# Patient Record
Sex: Female | Born: 1985
Health system: Southern US, Community
[De-identification: ages and names within clinical notes are randomized; demographics above are authoritative.]

## PROBLEM LIST (undated history)

## (undated) DIAGNOSIS — K219 Gastro-esophageal reflux disease without esophagitis: Secondary | ICD-10-CM

## (undated) DIAGNOSIS — J309 Allergic rhinitis, unspecified: Secondary | ICD-10-CM

## (undated) DIAGNOSIS — Z87440 Personal history of urinary (tract) infections: Secondary | ICD-10-CM

## (undated) HISTORY — DX: Personal history of urinary (tract) infections: Z87.440

## (undated) HISTORY — DX: Gastro-esophageal reflux disease without esophagitis: K21.9

## (undated) HISTORY — DX: Allergic rhinitis, unspecified: J30.9

---
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
## 2009-10-30 ENCOUNTER — Emergency Department (HOSPITAL_COMMUNITY): Admission: EM | Admit: 2009-10-30 | Discharge: 2009-10-30 | Payer: Self-pay | Admitting: Family Medicine

## 2011-03-24 LAB — POCT URINALYSIS DIP (DEVICE)
Ketones, ur: 15 mg/dL — AB
Protein, ur: NEGATIVE mg/dL
Specific Gravity, Urine: <=1.005 (ref 1.005–1.030)
Urobilinogen, UA: 0.2 mg/dL (ref 0.0–1.0)
pH: 6 (ref 5.0–8.0)

## 2011-03-24 LAB — URINE CULTURE

## 2011-07-01 ENCOUNTER — Inpatient Hospital Stay (INDEPENDENT_AMBULATORY_CARE_PROVIDER_SITE_OTHER)
Admission: RE | Admit: 2011-07-01 | Discharge: 2011-07-01 | Disposition: A | Discharge status: Home / Self Care | Payer: 59 | Source: Ambulatory Visit | Attending: Family Medicine | Admitting: Family Medicine

## 2011-07-01 DIAGNOSIS — L0231 Cutaneous abscess of buttock: Secondary | ICD-10-CM

## 2016-02-09 MED FILL — ESOMEPRAZOLE MAG DR 40 MG C: 40 | 90 days supply | Qty: 90 | Fill #5

## 2016-05-12 MED FILL — ESOMEPRAZOLE MAG DR 40 MG C: 40 | 90 days supply | Qty: 90 | Fill #6

## 2016-07-21 DIAGNOSIS — R51 Headache: Secondary | ICD-10-CM | POA: Diagnosis not present

## 2016-08-10 DIAGNOSIS — Z3169 Encounter for other general counseling and advice on procreation: Secondary | ICD-10-CM | POA: Diagnosis not present

## 2016-08-10 DIAGNOSIS — K219 Gastro-esophageal reflux disease without esophagitis: Secondary | ICD-10-CM | POA: Diagnosis not present

## 2016-08-10 DIAGNOSIS — Z01419 Encounter for gynecological examination (general) (routine) without abnormal findings: Secondary | ICD-10-CM | POA: Diagnosis not present

## 2016-08-12 MED FILL — ESOMEPRAZOLE MAG DR 40 MG C: 40 | 30 days supply | Qty: 30 | Fill #0

## 2016-09-16 MED FILL — ESOMEPRAZOLE MAG DR 40 MG C: 40 | 90 days supply | Qty: 90 | Fill #0

## 2016-12-14 MED FILL — ESOMEPRAZOLE MAG DR 40 MG C: 40 | 90 days supply | Qty: 90 | Fill #1

## 2017-03-15 MED FILL — ESOMEPRAZOLE MAG DR 40 MG C: 40 | 90 days supply | Qty: 90 | Fill #2

## 2017-06-14 MED FILL — ESOMEPRAZOLE MAG DR 40 MG C: 40 | 90 days supply | Qty: 90 | Fill #3

## 2017-09-19 ENCOUNTER — Ambulatory Visit (INDEPENDENT_AMBULATORY_CARE_PROVIDER_SITE_OTHER): Payer: 59 | Admitting: Obstetrics and Gynecology

## 2017-09-19 ENCOUNTER — Encounter: Payer: Self-pay | Admitting: Obstetrics and Gynecology

## 2017-09-19 VITALS — BP 120/76 | HR 75 | Ht 60.75 in | Wt 196.0 lb

## 2017-09-19 DIAGNOSIS — Z3009 Encounter for other general counseling and advice on contraception: Secondary | ICD-10-CM

## 2017-09-19 DIAGNOSIS — Z01419 Encounter for gynecological examination (general) (routine) without abnormal findings: Secondary | ICD-10-CM

## 2017-09-19 DIAGNOSIS — K219 Gastro-esophageal reflux disease without esophagitis: Secondary | ICD-10-CM | POA: Diagnosis not present

## 2017-09-19 MED ORDER — ESOMEPRAZOLE MAGNESIUM 40 MG PO CPDR: 40.0000 mg | DELAYED_RELEASE_CAPSULE | Freq: Every day | ORAL | 11 refills | Status: DC

## 2017-09-19 MED FILL — ESOMEPRAZOLE MAG DR 40 MG C: 40 | 30 days supply | Qty: 30 | Fill #0

## 2017-09-19 NOTE — Progress Notes (Signed)
PCP:  Patient, No Pcp Per   Chief Complaint  Patient presents with  . Gynecologic Exam     HPI:      [name] is a 31 y.o. G0P0000 who LMP was Patient's last menstrual period was 09/10/2017 (exact date)., presents today for her annual examination.  Her menses are regular every 28-30 days, lasting 7 days.  Dysmenorrhea mild, occurring first 1-2 days of flow. She does not have intermenstrual bleeding.  Sex activity: single partner, contraception - none. Pt and husband are separated but still sex active. Were trying to conceive since 2015 but not now. Pt had ovulation with serum progesterone.   Last Pap: 07/14/15  Results were: no abnormalities /neg HPV DNA  Hx of STDs: none  There is a FH of breast cancer in her MGM, genetic testing not indicated. There is no FH of ovarian cancer. The patient does not do self-breast exams.  Tobacco use: The patient currently smokes 1/2  packs of cigarettes per day for the past many years. Alcohol use: none No drug use.  Exercise: not active  She does not get adequate calcium and Vitamin D in her diet.  She has a hx of GERD and takes nexium daily. She needs Rx RF.   Past Medical History:  Diagnosis Date  . Allergic rhinitis   . GERD (gastroesophageal reflux disease)   . History of urinary tract infection     Past Surgical History:  Procedure Laterality Date  . APPENDECTOMY  2003    Family History  Problem Relation Age of Onset  . Endometriosis Mother   . Fibromyalgia Mother   . Irritable bowel syndrome Mother   . Melanoma Mother   . Hyperlipidemia Father   . Hypertension Father   . Crohn's disease Brother   . Bladder Cancer Maternal Grandmother 6  . Breast cancer Maternal Grandmother 62  . Hypertension Maternal Grandfather   . Lung cancer Maternal Grandfather 6  . Heart disease Paternal Grandfather   . Hypertension Paternal Grandfather     Social History   Social History  . Marital status: Legally Separated      Spouse name: N/A  . Number of children: N/A  . Years of education: N/A   Occupational History  . Not on file.   Social History Main Topics  . Smoking status: Current Every Day Smoker    Packs/day: 0.50    Types: Cigarettes  . Smokeless tobacco: Never Used  . Alcohol use Yes  . Drug use: No  . Sexual activity: Yes    Birth control/ protection: None   Other Topics Concern  . Not on file   Social History Narrative  . No narrative on file    Current Meds  Medication Sig  . esomeprazole (NEXIUM) 40 MG capsule Take 1 capsule (40 mg total) by mouth daily.  . fexofenadine (ALLEGRA) 180 MG tablet Take by mouth.  . [DISCONTINUED] esomeprazole (NEXIUM) 40 MG capsule      ROS:  Review of Systems  Constitutional: Negative for fatigue, fever and unexpected weight change.  Respiratory: Negative for cough, shortness of breath and wheezing.   Cardiovascular: Negative for chest pain, palpitations and leg swelling.  Gastrointestinal: Negative for blood in stool, constipation, diarrhea, nausea and vomiting.  Endocrine: Negative for cold intolerance, heat intolerance and polyuria.  Genitourinary: Negative for dyspareunia, dysuria, flank pain, frequency, genital sores, hematuria, menstrual problem, pelvic pain, urgency, vaginal bleeding, vaginal discharge and vaginal pain.  Musculoskeletal: Negative for back  pain, joint swelling and myalgias.  Skin: Negative for rash.  Neurological: Negative for dizziness, syncope, light-headedness, numbness and headaches.  Hematological: Negative for adenopathy.  Psychiatric/Behavioral: Negative for agitation, confusion, sleep disturbance and suicidal ideas. The patient is not nervous/anxious.      Objective: BP 120/76   Pulse 75   Ht 5' 0.75" (1.543 m)   Wt 196 lb (88.9 kg)   LMP 09/10/2017 (Exact Date)   BMI 37.34 kg/m    Physical Exam  Constitutional: She is oriented to person, place, and time. She appears well-developed and  well-nourished.  Genitourinary: Vagina normal and uterus normal. There is no rash or tenderness on the right labia. There is no rash or tenderness on the left labia. No erythema or tenderness in the vagina. No vaginal discharge found. Right adnexum does not display mass and does not display tenderness. Left adnexum does not display mass and does not display tenderness. Cervix does not exhibit motion tenderness or polyp. Uterus is not enlarged or tender.  Neck: Normal range of motion. No thyromegaly present.  Cardiovascular: Normal rate, regular rhythm and normal heart sounds.   No murmur heard. Pulmonary/Chest: Effort normal and breath sounds normal. Right breast exhibits no mass, no nipple discharge, no skin change and no tenderness. Left breast exhibits no mass, no nipple discharge, no skin change and no tenderness.  Abdominal: Soft. There is no tenderness. There is no guarding.  Musculoskeletal: Normal range of motion.  Neurological: She is alert and oriented to person, place, and time. No cranial nerve deficit.  Psychiatric: She has a normal mood and affect. Her behavior is normal.  Vitals reviewed.   Assessment/Plan: Encounter for annual routine gynecological examination  Encounter for other general counseling or advice on contraception - Pt declines BC for now. Will f/u prn.  Gastroesophageal reflux disease without esophagitis - Rx RF nexium.  - Plan: esomeprazole (NEXIUM) 40 MG capsule            GYN counsel family planning choices, adequate intake of calcium and vitamin D, diet and exercise     F/U  Return in about 1 year (around 09/19/2018).  Elizabeth Day B. Kahlan Engebretson, PA-C 09/19/2017 11:25 AM

## 2017-10-17 MED FILL — ESOMEPRAZOLE MAG DR 40 MG C: 40 | 90 days supply | Qty: 90 | Fill #1

## 2018-01-17 MED FILL — ESOMEPRAZOLE MAG DR 40 MG C: 40 | 90 days supply | Qty: 90 | Fill #2

## 2018-04-18 MED FILL — ESOMEPRAZOLE MAG DR 40 MG C: 40 | 90 days supply | Qty: 90 | Fill #3

## 2018-07-03 ENCOUNTER — Other Ambulatory Visit: Payer: Self-pay | Admitting: Certified Nurse Midwife

## 2018-07-18 ENCOUNTER — Other Ambulatory Visit: Payer: Self-pay | Admitting: Obstetrics and Gynecology

## 2018-07-18 DIAGNOSIS — K219 Gastro-esophageal reflux disease without esophagitis: Secondary | ICD-10-CM

## 2018-07-18 MED FILL — ESOMEPRAZOLE MAG DR 40 MG C: 40 | 90 days supply | Qty: 90 | Fill #0

## 2018-07-18 NOTE — Telephone Encounter (Signed)
Please advise. Thank you

## 2018-09-20 ENCOUNTER — Ambulatory Visit (INDEPENDENT_AMBULATORY_CARE_PROVIDER_SITE_OTHER): Payer: 59 | Admitting: Obstetrics and Gynecology

## 2018-09-20 ENCOUNTER — Other Ambulatory Visit (HOSPITAL_COMMUNITY)
Admission: RE | Admit: 2018-09-20 | Discharge: 2018-09-20 | Disposition: A | Discharge status: Home / Self Care | Payer: 59 | Source: Ambulatory Visit | Attending: Obstetrics and Gynecology | Admitting: Obstetrics and Gynecology

## 2018-09-20 ENCOUNTER — Encounter: Payer: Self-pay | Admitting: Obstetrics and Gynecology

## 2018-09-20 VITALS — BP 130/64 | HR 71 | Ht 61.0 in | Wt 172.0 lb

## 2018-09-20 DIAGNOSIS — Z124 Encounter for screening for malignant neoplasm of cervix: Secondary | ICD-10-CM

## 2018-09-20 DIAGNOSIS — Z3009 Encounter for other general counseling and advice on contraception: Secondary | ICD-10-CM

## 2018-09-20 DIAGNOSIS — Z720 Tobacco use: Secondary | ICD-10-CM | POA: Diagnosis not present

## 2018-09-20 DIAGNOSIS — Z01411 Encounter for gynecological examination (general) (routine) with abnormal findings: Secondary | ICD-10-CM | POA: Diagnosis not present

## 2018-09-20 DIAGNOSIS — K219 Gastro-esophageal reflux disease without esophagitis: Secondary | ICD-10-CM | POA: Diagnosis not present

## 2018-09-20 DIAGNOSIS — Z1151 Encounter for screening for human papillomavirus (HPV): Secondary | ICD-10-CM | POA: Insufficient documentation

## 2018-09-20 DIAGNOSIS — Z113 Encounter for screening for infections with a predominantly sexual mode of transmission: Secondary | ICD-10-CM | POA: Diagnosis not present

## 2018-09-20 DIAGNOSIS — Z01419 Encounter for gynecological examination (general) (routine) without abnormal findings: Secondary | ICD-10-CM

## 2018-09-20 MED ORDER — ESOMEPRAZOLE MAGNESIUM 40 MG PO CPDR: 40.0000 mg | DELAYED_RELEASE_CAPSULE | Freq: Every day | ORAL | 3 refills | Status: DC

## 2018-09-20 NOTE — Patient Instructions (Signed)
I value your feedback and entrusting us with your care. If you get a [facility] patient survey, I would appreciate you taking the time to let us know about your experience today. Thank you! 

## 2018-09-20 NOTE — Progress Notes (Signed)
PCP:  Patient, No Pcp Per   Chief Complaint  Patient presents with  . Gynecologic Exam    pt would like std testing, interested in [facility]     HPI:      [name] is a 32 y.o. G0P0000 who LMP was Patient's last menstrual period was 09/18/2018 (exact date)., presents today for her annual examination.  Her menses are regular every 28-30 days, lasting 7 days.  Dysmenorrhea mild, occurring first 1-2 days of flow. She does not have intermenstrual bleeding.  Sex activity: single partner, contraception - condoms sometimes. Considering BC again but feels better off OCPs.  Hx of infertility in past with ex-husband. Pt had ovulation with serum progesterone at that time.  Last Pap: 07/14/15  Results were: no abnormalities /neg HPV DNA  Hx of STDs: none  There is a FH of breast cancer in her MGM, genetic testing not indicated. There is no FH of ovarian cancer. The patient does not do self-breast exams.  Tobacco use: 1 ppd, up from 1/2 ppd last yr.  Alcohol use: daily No drug use.  Exercise: not active  She does not get adequate calcium and Vitamin D in her diet.  She has a hx of GERD and takes nexium daily. She needs Rx RF.   Past Medical History:  Diagnosis Date  . Allergic rhinitis   . GERD (gastroesophageal reflux disease)   . History of urinary tract infection     Past Surgical History:  Procedure Laterality Date  . APPENDECTOMY  2003    Family History  Problem Relation Age of Onset  . Endometriosis Mother   . Fibromyalgia Mother   . Irritable bowel syndrome Mother   . Melanoma Mother   . Hyperlipidemia Father   . Hypertension Father   . Crohn's disease Brother   . Bladder Cancer Maternal Grandmother 39  . Breast cancer Maternal Grandmother 42  . Hypertension Maternal Grandfather   . Lung cancer Maternal Grandfather 54  . Heart disease Paternal Grandfather   . Hypertension Paternal Grandfather     Social History   Socioeconomic History  . Marital  status: Legally Separated    Spouse name: Not on file  . Number of children: Not on file  . Years of education: Not on file  . Highest education level: Not on file  Occupational History  . Not on file  Social Needs  . Financial resource strain: Not on file  . Food insecurity:    Worry: Not on file    Inability: Not on file  . Transportation needs:    Medical: Not on file    Non-medical: Not on file  Tobacco Use  . Smoking status: Current Every Day Smoker    Packs/day: 0.50    Types: Cigarettes  . Smokeless tobacco: Never Used  Substance and Sexual Activity  . Alcohol use: Yes  . Drug use: No  . Sexual activity: Yes    Birth control/protection: None  Lifestyle  . Physical activity:    Days per week: Not on file    Minutes per session: Not on file  . Stress: Not on file  Relationships  . Social connections:    Talks on phone: Not on file    Gets together: Not on file    Attends religious service: Not on file    Active member of club or organization: Not on file    Attends meetings of clubs or organizations: Not on file    Relationship  status: Not on file  . Intimate partner violence:    Fear of current or ex partner: Not on file    Emotionally abused: Not on file    Physically abused: Not on file    Forced sexual activity: Not on file  Other Topics Concern  . Not on file  Social History Narrative  . Not on file    Current Meds  Medication Sig  . esomeprazole (NEXIUM) 40 MG capsule Take 1 capsule (40 mg total) by mouth daily.  . fexofenadine (ALLEGRA) 180 MG tablet Take by mouth.  . [DISCONTINUED] esomeprazole (NEXIUM) 40 MG capsule TAKE 1 CAPSULE BY MOUTH ONCE DAILY     ROS:  Review of Systems  Constitutional: Negative for fatigue, fever and unexpected weight change.  Respiratory: Negative for cough, shortness of breath and wheezing.   Cardiovascular: Negative for chest pain, palpitations and leg swelling.  Gastrointestinal: Negative for blood in stool,  constipation, diarrhea, nausea and vomiting.  Endocrine: Negative for cold intolerance, heat intolerance and polyuria.  Genitourinary: Negative for dyspareunia, dysuria, flank pain, frequency, genital sores, hematuria, menstrual problem, pelvic pain, urgency, vaginal bleeding, vaginal discharge and vaginal pain.  Musculoskeletal: Negative for back pain, joint swelling and myalgias.  Skin: Negative for rash.  Neurological: Negative for dizziness, syncope, light-headedness, numbness and headaches.  Hematological: Negative for adenopathy.  Psychiatric/Behavioral: Negative for agitation, confusion, sleep disturbance and suicidal ideas. The patient is not nervous/anxious.      Objective: BP 130/64   Pulse 71   Ht 5\' 1"  (1.549 m)   Wt 172 lb (78 kg)   LMP 09/18/2018 (Exact Date)   BMI 32.50 kg/m    Physical Exam  Constitutional: She is oriented to person, place, and time. She appears well-developed and well-nourished.  Genitourinary: Vagina normal and uterus normal. There is no rash or tenderness on the right labia. There is no rash or tenderness on the left labia. No erythema or tenderness in the vagina. No vaginal discharge found. Right adnexum does not display mass and does not display tenderness. Left adnexum does not display mass and does not display tenderness. Cervix does not exhibit motion tenderness or polyp. Uterus is not enlarged or tender.  Neck: Normal range of motion. No thyromegaly present.  Cardiovascular: Normal rate, regular rhythm and normal heart sounds.  No murmur heard. Pulmonary/Chest: Effort normal and breath sounds normal. Right breast exhibits no mass, no nipple discharge, no skin change and no tenderness. Left breast exhibits no mass, no nipple discharge, no skin change and no tenderness.  Abdominal: Soft. There is no tenderness. There is no guarding.  Musculoskeletal: Normal range of motion.  Neurological: She is alert and oriented to person, place, and time. No  cranial nerve deficit.  Psychiatric: She has a normal mood and affect. Her behavior is normal.  Vitals reviewed.   Assessment/Plan: Encounter for annual routine gynecological examination  Cervical cancer screening - Plan: Cytology - PAP, CANCELED: Cytology - PAP  Screening for STD (sexually transmitted disease) - Plan: Cytology - PAP  Screening for HPV (human papillomavirus) - Plan: Cytology - PAP, CANCELED: Cytology - PAP  Encounter for other general counseling or advice on contraception - BC options discussed. Recommended kyleena. Handout given. RTO with menses if desires. Will need cytotec Rx. F/u prn.   Gastroesophageal reflux disease without esophagitis - Rx RF nexium. D/C tobacco to help wiht sx.  - Plan: esomeprazole (NEXIUM) 40 MG capsule  Tobacco use - Recommended cessation    Meds ordered this  encounter  Medications  . esomeprazole (NEXIUM) 40 MG capsule    Sig: Take 1 capsule (40 mg total) by mouth daily.    Dispense:  90 capsule    Refill:  3    Order Specific Question:   Supervising Provider    Answer:   Gae Dry [[id number]]            GYN counsel family planning choices, adequate intake of calcium and vitamin D, diet and exercise     F/U  Return in about 1 year (around 09/21/2019).  Alicia B. Copland, PA-C 09/20/2018 10:28 AM

## 2018-09-21 LAB — CYTOLOGY - PAP
Adequacy: ABSENT
Chlamydia: NEGATIVE
DIAGNOSIS: NEGATIVE
HPV (WINDOPATH): NOT DETECTED
Neisseria Gonorrhea: NEGATIVE
TRICH (WINDOWPATH): NEGATIVE

## 2018-09-22 ENCOUNTER — Telehealth: Payer: 59 | Admitting: Family

## 2018-09-22 DIAGNOSIS — N390 Urinary tract infection, site not specified: Secondary | ICD-10-CM | POA: Diagnosis not present

## 2018-09-22 MED ORDER — NITROFURANTOIN MONOHYD MACRO 100 MG PO CAPS: 100.0000 mg | ORAL_CAPSULE | Freq: Two times a day (BID) | ORAL | 0 refills | Status: DC

## 2018-09-22 NOTE — Progress Notes (Signed)

## 2018-10-17 MED FILL — ESOMEPRAZOLE MAG DR 40 MG C: 40 | 90 days supply | Qty: 90 | Fill #0

## 2019-01-16 MED FILL — ESOMEPRAZOLE MAG DR 40 MG C: 40 | 90 days supply | Qty: 90 | Fill #1

## 2019-04-10 MED FILL — ESOMEPRAZOLE MAG DR 40 MG C: 40 | 90 days supply | Qty: 90 | Fill #2

## 2019-07-16 MED FILL — ESOMEPRAZOLE MAG DR 40 MG C: 40 | 90 days supply | Qty: 90 | Fill #3

## 2019-10-03 ENCOUNTER — Other Ambulatory Visit: Payer: Self-pay | Admitting: Obstetrics and Gynecology

## 2019-10-03 DIAGNOSIS — K219 Gastro-esophageal reflux disease without esophagitis: Secondary | ICD-10-CM

## 2019-10-08 MED FILL — ESOMEPRAZOLE MAG DR 40 MG C: 40 | 90 days supply | Qty: 90 | Fill #0

## 2019-10-16 NOTE — Progress Notes (Signed)
PCP:  Patient, No Pcp Per   Chief Complaint  Patient presents with  . Gynecologic Exam     HPI:      [name] is a 33 y.o. G0P0000 who LMP was Patient's last menstrual period was 09/23/2019 (exact date)., presents today for her annual examination.  Her menses are regular every 28-30 days, lasting 7 days.  Dysmenorrhea mild, occurring first 1-2 days of flow. She does not have intermenstrual bleeding.  Sex activity: single partner, contraception - none. Declines BC. Not taking PNVs. Was considering BC again last yr but feels better off OCPs.  Hx of infertility in past with ex-husband. Pt had ovulation with serum progesterone at that time.  Last Pap: 09/20/18  Results were: no abnormalities /neg HPV DNA Hx of STDs: none; would like STD testing today.  There is a FH of breast cancer in her MGM, genetic testing not indicated. There is no FH of ovarian cancer. The patient does not do self-breast exams.  Tobacco use: 1 ppd, used to be 1/2 ppd Alcohol use: none No drug use.  Exercise: not active  She does not get adequate calcium and Vitamin D in her diet.  She has a hx of GERD and takes nexium daily with sx relief.  She needs Rx RF.   Past Medical History:  Diagnosis Date  . Allergic rhinitis   . GERD (gastroesophageal reflux disease)   . History of urinary tract infection     Past Surgical History:  Procedure Laterality Date  . APPENDECTOMY  2003    Family History  Problem Relation Age of Onset  . Endometriosis Mother   . Fibromyalgia Mother   . Irritable bowel syndrome Mother   . Melanoma Mother   . Hyperlipidemia Father   . Hypertension Father   . Crohn's disease Brother   . Bladder Cancer Maternal Grandmother 63  . Breast cancer Maternal Grandmother 70       has contact  . Hypertension Maternal Grandfather   . Lung cancer Maternal Grandfather 84  . Heart disease Paternal Grandfather   . Hypertension Paternal Grandfather     Social History    Socioeconomic History  . Marital status: Legally Separated    Spouse name: Not on file  . Number of children: Not on file  . Years of education: Not on file  . Highest education level: Not on file  Occupational History  . Not on file  Social Needs  . Financial resource strain: Not on file  . Food insecurity    Worry: Not on file    Inability: Not on file  . Transportation needs    Medical: Not on file    Non-medical: Not on file  Tobacco Use  . Smoking status: Current Every Day Smoker    Packs/day: 0.50    Types: Cigarettes  . Smokeless tobacco: Never Used  Substance and Sexual Activity  . Alcohol use: Yes  . Drug use: No  . Sexual activity: Yes    Birth control/protection: None  Lifestyle  . Physical activity    Days per week: Not on file    Minutes per session: Not on file  . Stress: Not on file  Relationships  . Social Herbalist on phone: Not on file    Gets together: Not on file    Attends religious service: Not on file    Active member of club or organization: Not on file    Attends meetings of  clubs or organizations: Not on file    Relationship status: Not on file  . Intimate partner violence    Fear of current or ex partner: Not on file    Emotionally abused: Not on file    Physically abused: Not on file    Forced sexual activity: Not on file  Other Topics Concern  . Not on file  Social History Narrative  . Not on file    Current Meds  Medication Sig  . esomeprazole (NEXIUM) 40 MG capsule Take 1 capsule (40 mg total) by mouth daily.  . fexofenadine (ALLEGRA) 180 MG tablet Take by mouth.  . [DISCONTINUED] esomeprazole (NEXIUM) 40 MG capsule TAKE 1 CAPSULE BY MOUTH DAILY.     ROS:  Review of Systems  Constitutional: Negative for fatigue, fever and unexpected weight change.  Respiratory: Negative for cough, shortness of breath and wheezing.   Cardiovascular: Negative for chest pain, palpitations and leg swelling.  Gastrointestinal:  Negative for blood in stool, constipation, diarrhea, nausea and vomiting.  Endocrine: Negative for cold intolerance, heat intolerance and polyuria.  Genitourinary: Negative for dyspareunia, dysuria, flank pain, frequency, genital sores, hematuria, menstrual problem, pelvic pain, urgency, vaginal bleeding, vaginal discharge and vaginal pain.  Musculoskeletal: Negative for back pain, joint swelling and myalgias.  Skin: Negative for rash.  Neurological: Negative for dizziness, syncope, light-headedness, numbness and headaches.  Hematological: Negative for adenopathy.  Psychiatric/Behavioral: Negative for agitation, confusion, sleep disturbance and suicidal ideas. The patient is not nervous/anxious.      Objective: BP 100/80   Ht 5' 3.75" (1.619 m)   Wt 179 lb (81.2 kg)   LMP 09/23/2019 (Exact Date)   BMI 30.97 kg/m    Physical Exam Constitutional:      Appearance: She is well-developed.  Genitourinary:     Vulva, vagina, uterus, right adnexa and left adnexa normal.     No vulval lesion or tenderness noted.     No vaginal discharge, erythema or tenderness.     No cervical motion tenderness or polyp.     Uterus is not enlarged or tender.     No right or left adnexal mass present.     Right adnexa not tender.     Left adnexa not tender.  Neck:     Musculoskeletal: Normal range of motion.     Thyroid: No thyromegaly.  Cardiovascular:     Rate and Rhythm: Normal rate and regular rhythm.     Heart sounds: Normal heart sounds. No murmur.  Pulmonary:     Effort: Pulmonary effort is normal.     Breath sounds: Normal breath sounds.  Chest:     Breasts:        Right: No mass, nipple discharge, skin change or tenderness.        Left: No mass, nipple discharge, skin change or tenderness.  Abdominal:     Palpations: Abdomen is soft.     Tenderness: There is no abdominal tenderness. There is no guarding.  Musculoskeletal: Normal range of motion.  Neurological:     General: No focal  deficit present.     Mental Status: She is alert and oriented to person, place, and time.     Cranial Nerves: No cranial nerve deficit.  Skin:    General: Skin is warm and dry.  Psychiatric:        Mood and Affect: Mood normal.        Behavior: Behavior normal.        Thought Content: Thought  content normal.        Judgment: Judgment normal.  Vitals signs reviewed.     Assessment/Plan: Encounter for annual routine gynecological examination  Screening for STD (sexually transmitted disease) - Plan: Cervicovaginal ancillary only  Encounter for other general counseling or advice on contraception; Pt to f/u if desires BC. Start PNVs in meantime.  Gastroesophageal reflux disease without esophagitis - Rx RF nexium. D/C tobacco to help wiht sx.  - Plan: esomeprazole (NEXIUM) 40 MG capsule    Meds ordered this encounter  Medications  . esomeprazole (NEXIUM) 40 MG capsule    Sig: Take 1 capsule (40 mg total) by mouth daily.    Dispense:  90 capsule    Refill:  3    Order Specific Question:   Supervising Provider    Answer:   Gae Dry [id number]            GYN counsel family planning choices, adequate intake of calcium and vitamin D, diet and exercise     F/U  Return in about 1 year (around 10/16/2020).   B. , PA-C 10/17/2019 11:32 AM

## 2019-10-17 ENCOUNTER — Ambulatory Visit (INDEPENDENT_AMBULATORY_CARE_PROVIDER_SITE_OTHER): Payer: 59 | Admitting: Obstetrics and Gynecology

## 2019-10-17 ENCOUNTER — Other Ambulatory Visit: Payer: Self-pay

## 2019-10-17 ENCOUNTER — Other Ambulatory Visit (HOSPITAL_COMMUNITY)
Admission: RE | Admit: 2019-10-17 | Discharge: 2019-10-17 | Disposition: A | Discharge status: Home / Self Care | Payer: 59 | Source: Ambulatory Visit | Attending: Obstetrics and Gynecology | Admitting: Obstetrics and Gynecology

## 2019-10-17 ENCOUNTER — Encounter: Payer: Self-pay | Admitting: Obstetrics and Gynecology

## 2019-10-17 VITALS — BP 100/80 | Ht 63.75 in | Wt 179.0 lb

## 2019-10-17 DIAGNOSIS — Z113 Encounter for screening for infections with a predominantly sexual mode of transmission: Secondary | ICD-10-CM | POA: Diagnosis not present

## 2019-10-17 DIAGNOSIS — Z01419 Encounter for gynecological examination (general) (routine) without abnormal findings: Secondary | ICD-10-CM | POA: Diagnosis not present

## 2019-10-17 DIAGNOSIS — Z3009 Encounter for other general counseling and advice on contraception: Secondary | ICD-10-CM

## 2019-10-17 DIAGNOSIS — K219 Gastro-esophageal reflux disease without esophagitis: Secondary | ICD-10-CM

## 2019-10-17 MED ORDER — ESOMEPRAZOLE MAGNESIUM 40 MG PO CPDR: 40.0000 mg | DELAYED_RELEASE_CAPSULE | Freq: Every day | ORAL | 3 refills | Status: DC

## 2019-10-17 NOTE — Patient Instructions (Signed)
I value your feedback and entrusting us with your care. If you get a [facility] patient survey, I would appreciate you taking the time to let us know about your experience today. Thank you! 

## 2019-10-19 LAB — CERVICOVAGINAL ANCILLARY ONLY
Chlamydia: NEGATIVE
Comment: NEGATIVE
Comment: NORMAL
Neisseria Gonorrhea: NEGATIVE

## 2019-11-06 ENCOUNTER — Telehealth: Payer: 59 | Admitting: Emergency Medicine

## 2019-11-06 DIAGNOSIS — R300 Dysuria: Secondary | ICD-10-CM | POA: Diagnosis not present

## 2019-11-06 MED ORDER — CEPHALEXIN 500 MG PO CAPS: 500.0000 mg | ORAL_CAPSULE | Freq: Two times a day (BID) | ORAL | 0 refills | Status: DC

## 2019-11-06 MED FILL — CEPHALEXIN 500 MG CAPSULE: 500 | 7 days supply | Qty: 14 | Fill #0

## 2019-11-06 NOTE — Progress Notes (Signed)
We are sorry that you are not feeling well.  Here is how we plan to help!  Based on what you shared with me it looks like you most likely have a simple urinary tract infection.  A UTI (Urinary Tract Infection) is a bacterial infection of the bladder.  Most cases of urinary tract infections are simple to treat but a key part of your care is to encourage you to drink plenty of fluids and watch your symptoms carefully.  I have prescribed Keflex 500 mg twice a day for 7 days.  Your symptoms should gradually improve. Call us if the burning in your urine worsens, you develop worsening fever, back pain or pelvic pain or if your symptoms do not resolve after completing the antibiotic.  In reviewing your medical record, it appears you have been seen for this complaint before during an e-visit.  Our standard of care is to not re-treat UTI symptoms that occur more than every so often.  If you don't have complete resolution of your symptoms with the medication I've prescribed, you will need to be seen in-person and have a formal urinalysis and urine culture.  Sometimes the bacteria causing your symptoms is resistant to antibiotics, and we would need a urine culture to know how to best treat you.  Urinary tract infections can be prevented by drinking plenty of water to keep your body hydrated.  Also be sure when you wipe, wipe from front to back and don't hold it in!  If possible, empty your bladder every 4 hours.  Your e-visit answers were reviewed by a board certified advanced clinical practitioner to complete your personal care plan.  Depending on the condition, your plan could have included both over the counter or prescription medications.  If there is a problem please reply  once you have received a response from your provider.  Your safety is important to [place].  If you have drug allergies check your prescription carefully.    You can use MyChart to ask questions about today's visit, request a non-urgent  call back, or ask for a work or school excuse for 24 hours related to this e-Visit. If it has been greater than 24 hours you will need to follow up with your provider, or enter a new e-Visit to address those concerns.   You will get an e-mail in the next two days asking about your experience.  I hope that your e-visit has been valuable and will speed your recovery. Thank you for using e-visits.  Greater than 5 minutes, yet less than 10 minutes was used in reviewing the patient's chart, questionnaire, prescribing medications, and documentation for this visit.

## 2019-11-27 ENCOUNTER — Telehealth: Payer: 59 | Admitting: Nurse Practitioner

## 2019-11-27 DIAGNOSIS — N3000 Acute cystitis without hematuria: Secondary | ICD-10-CM

## 2019-11-27 MED ORDER — NITROFURANTOIN MONOHYD MACRO 100 MG PO CAPS: 100.0000 mg | ORAL_CAPSULE | Freq: Two times a day (BID) | ORAL | 0 refills | Status: DC

## 2019-11-27 NOTE — Progress Notes (Signed)

## 2019-12-05 ENCOUNTER — Encounter: Payer: Self-pay | Admitting: Obstetrics and Gynecology

## 2019-12-05 NOTE — Progress Notes (Signed)
Patient, No Pcp Per   Chief Complaint  Patient presents with  . STD testing  . Urinary Tract Infection    no sx currently, treated with abx nov and dec    HPI:      Ms. AUDRIAUNA Day is a 33 y.o. G0P0000 who LMP was Patient's last menstrual period was 11/11/2019 (exact date)., presents today for STD testing. Nov partner recently notified her of chlamydia dx, had unprotected sex. Pt has had 2 UTIs since being with him but sx were classic for UTI with frequency/urgency/dysuria/hematuria. Treated with keflex and macrobid with sx relief. Hx of UTIs in past. No hx of STDs, had neg testing at 10/20 annual. Pt is sex active with new partner, not using condoms. No vag sx, no irreg menses. Has mild pelvic discomfort, feels like full bladder.   Patient Active Problem List   Diagnosis Date Noted  . Gastroesophageal reflux disease without esophagitis 10/17/2019  . Tobacco use 09/20/2018    Past Surgical History:  Procedure Laterality Date  . APPENDECTOMY  2003    Family History  Problem Relation Age of Onset  . Endometriosis Mother   . Fibromyalgia Mother   . Irritable bowel syndrome Mother   . Melanoma Mother   . Hyperlipidemia Father   . Hypertension Father   . Crohn's disease Brother   . Bladder Cancer Maternal Grandmother 68  . Breast cancer Maternal Grandmother 70       has contact  . Hypertension Maternal Grandfather   . Lung cancer Maternal Grandfather 43  . Heart disease Paternal Grandfather   . Hypertension Paternal Grandfather     Social History   Socioeconomic History  . Marital status: Legally Separated    Spouse name: Not on file  . Number of children: Not on file  . Years of education: Not on file  . Highest education level: Not on file  Occupational History  . Not on file  Tobacco Use  . Smoking status: Current Every Day Smoker    Packs/day: 0.50    Types: Cigarettes  . Smokeless tobacco: Never Used  Substance and Sexual Activity  . Alcohol  use: Yes  . Drug use: No  . Sexual activity: Yes    Birth control/protection: None  Other Topics Concern  . Not on file  Social History Narrative  . Not on file   Social Determinants of Health   Financial Resource Strain:   . Difficulty of Paying Living Expenses: Not on file  Food Insecurity:   . Worried About Charity fundraiser in the Last Year: Not on file  . Ran Out of Food in the Last Year: Not on file  Transportation Needs:   . Lack of Transportation (Medical): Not on file  . Lack of Transportation (Non-Medical): Not on file  Physical Activity:   . Days of Exercise per Week: Not on file  . Minutes of Exercise per Session: Not on file  Stress:   . Feeling of Stress : Not on file  Social Connections:   . Frequency of Communication with Friends and Family: Not on file  . Frequency of Social Gatherings with Friends and Family: Not on file  . Attends Religious Services: Not on file  . Active Member of Clubs or Organizations: Not on file  . Attends Archivist Meetings: Not on file  . Marital Status: Not on file  Intimate Partner Violence:   . Fear of Current or Ex-Partner: Not on file  .  Emotionally Abused: Not on file  . Physically Abused: Not on file  . Sexually Abused: Not on file    Outpatient Medications Prior to Visit  Medication Sig Dispense Refill  . esomeprazole (NEXIUM) 40 MG capsule Take 1 capsule (40 mg total) by mouth daily. 90 capsule 3  . fexofenadine (ALLEGRA) 180 MG tablet Take by mouth.    . cephALEXin (KEFLEX) 500 MG capsule Take 1 capsule (500 mg total) by mouth 2 (two) times daily. 14 capsule 0  . nitrofurantoin, macrocrystal-monohydrate, (MACROBID) 100 MG capsule Take 1 capsule (100 mg total) by mouth 2 (two) times daily. 1 po BId 14 capsule 0   No facility-administered medications prior to visit.      ROS:  Review of Systems  Constitutional: Negative for fever.  Gastrointestinal: Negative for blood in stool, constipation,  diarrhea, nausea and vomiting.  Genitourinary: Positive for pelvic pain. Negative for dyspareunia, dysuria, flank pain, frequency, hematuria, urgency, vaginal bleeding, vaginal discharge and vaginal pain.  Musculoskeletal: Negative for back pain.  Skin: Negative for rash.   BREAST: No symptoms   OBJECTIVE:   Vitals:  BP 120/74   Ht 5' 0.75" (1.543 m)   Wt 179 lb (81.2 kg)   LMP 11/11/2019 (Exact Date)   BMI 34.10 kg/m   Physical Exam Vitals reviewed.  Constitutional:      Appearance: She is well-developed.  Pulmonary:     Effort: Pulmonary effort is normal.  Genitourinary:    General: Normal vulva.     Pubic Area: No rash.      Labia:        Right: No rash, tenderness or lesion.        Left: No rash, tenderness or lesion.      Vagina: Normal. No vaginal discharge, erythema or tenderness.     Cervix: Normal.     Uterus: Normal. Tender. Not enlarged.      Adnexa: Right adnexa normal and left adnexa normal.       Right: No mass or tenderness.         Left: No mass or tenderness.    Musculoskeletal:        General: Normal range of motion.     Cervical back: Normal range of motion.  Skin:    General: Skin is warm and dry.  Neurological:     General: No focal deficit present.     Mental Status: She is alert and oriented to person, place, and time.  Psychiatric:        Mood and Affect: Mood normal.        Behavior: Behavior normal.        Thought Content: Thought content normal.        Judgment: Judgment normal.     Results: Results for orders placed or performed in visit on 12/06/19 (from the past 24 hour(s))  POCT Urinalysis Dipstick     Status: Normal   Collection Time: 12/06/19 11:23 AM  Result Value Ref Range   Color, UA yellow    Clarity, UA clear    Glucose, UA Negative Negative   Bilirubin, UA neg    Ketones, UA neg    Spec Grav, UA 1.010 1.010 - 1.025   Blood, UA neg    pH, UA 7.0 5.0 - 8.0   Protein, UA Negative Negative   Urobilinogen, UA      Nitrite, UA neg    Leukocytes, UA Negative Negative   Appearance     Odor  Assessment/Plan: Screening for STD (sexually transmitted disease) - Plan: [place] STD; Will f/u with results if pos.  Exposure to chlamydia - Plan: [facility] STD  Pelvic pain - Plan: POCT Urinalysis Dipstick; Neg UA. Rule out STD. If neg, could be bladder spasm--d/c caffeine. F/u prn.    Return if symptoms worsen or fail to improve.  Yitzchok Carriger B. Romayne Ticas, PA-C 12/06/2019 11:24 AM

## 2019-12-05 NOTE — Patient Instructions (Signed)
I value your feedback and entrusting us with your care. If you get a [facility] patient survey, I would appreciate you taking the time to let us know about your experience today. Thank you!  As of November 29, 2019, your lab results will be released to your MyChart immediately, before I even have a chance to see them. Please give me time to review them and contact you if there are any abnormalities. Thank you for your patience.  

## 2019-12-06 ENCOUNTER — Other Ambulatory Visit: Payer: Self-pay

## 2019-12-06 ENCOUNTER — Encounter: Payer: Self-pay | Admitting: Obstetrics and Gynecology

## 2019-12-06 ENCOUNTER — Ambulatory Visit (INDEPENDENT_AMBULATORY_CARE_PROVIDER_SITE_OTHER): Payer: 59 | Admitting: Obstetrics and Gynecology

## 2019-12-06 ENCOUNTER — Other Ambulatory Visit (HOSPITAL_COMMUNITY)
Admission: RE | Admit: 2019-12-06 | Discharge: 2019-12-06 | Disposition: A | Discharge status: Home / Self Care | Payer: 59 | Source: Ambulatory Visit | Attending: Obstetrics and Gynecology | Admitting: Obstetrics and Gynecology

## 2019-12-06 VITALS — BP 120/74 | Ht 60.75 in | Wt 179.0 lb

## 2019-12-06 DIAGNOSIS — Z113 Encounter for screening for infections with a predominantly sexual mode of transmission: Secondary | ICD-10-CM | POA: Insufficient documentation

## 2019-12-06 DIAGNOSIS — R102 Pelvic and perineal pain: Secondary | ICD-10-CM

## 2019-12-06 DIAGNOSIS — Z202 Contact with and (suspected) exposure to infections with a predominantly sexual mode of transmission: Secondary | ICD-10-CM | POA: Insufficient documentation

## 2019-12-06 LAB — POCT URINALYSIS DIPSTICK
Bilirubin, UA: NEGATIVE
Blood, UA: NEGATIVE
Glucose, UA: NEGATIVE
Ketones, UA: NEGATIVE
Leukocytes, UA: NEGATIVE
Nitrite, UA: NEGATIVE
Protein, UA: NEGATIVE
Spec Grav, UA: 1.01 (ref 1.010–1.025)
pH, UA: 7 (ref 5.0–8.0)

## 2019-12-07 LAB — CERVICOVAGINAL ANCILLARY ONLY
Chlamydia: NEGATIVE
Comment: NEGATIVE
Comment: NEGATIVE
Comment: NORMAL
Neisseria Gonorrhea: NEGATIVE
Trichomonas: NEGATIVE

## 2019-12-16 ENCOUNTER — Other Ambulatory Visit: Payer: Self-pay | Admitting: Obstetrics and Gynecology

## 2019-12-16 ENCOUNTER — Encounter: Payer: Self-pay | Admitting: Obstetrics and Gynecology

## 2019-12-16 DIAGNOSIS — R300 Dysuria: Secondary | ICD-10-CM

## 2019-12-16 DIAGNOSIS — N3000 Acute cystitis without hematuria: Secondary | ICD-10-CM

## 2019-12-16 MED ORDER — NITROFURANTOIN MONOHYD MACRO 100 MG PO CAPS: 100.0000 mg | ORAL_CAPSULE | Freq: Two times a day (BID) | ORAL | 0 refills | Status: AC

## 2019-12-16 NOTE — Progress Notes (Signed)
Patient called triage line with complaints of urinary retention for 4 hours, dysuria and increase frequency. Denies fever or back pain. RX sent for macrobid which has worked well for her in the past. Close follow up advised today if she is not able to urinate after 2 more hours, if she has fever or continued symptoms after starting antibiotic.   Adrian Prows MD [facility], [facility] 12/16/2019 3:15 PM

## 2019-12-17 ENCOUNTER — Encounter: Payer: Self-pay | Admitting: Obstetrics and Gynecology

## 2020-01-01 ENCOUNTER — Encounter: Payer: Self-pay | Admitting: Obstetrics and Gynecology

## 2020-01-01 ENCOUNTER — Telehealth: Payer: Self-pay

## 2020-01-01 ENCOUNTER — Ambulatory Visit: Payer: 59

## 2020-01-01 ENCOUNTER — Other Ambulatory Visit: Payer: Self-pay

## 2020-01-01 DIAGNOSIS — R300 Dysuria: Secondary | ICD-10-CM

## 2020-01-01 NOTE — Telephone Encounter (Signed)
Pt calling triage c/o recurrent uti symptoms. Abc pt. Can you please put her on schedule for 4:30 today for nurse visit? She is aware to be here by 4:30 to make sure a nurse has time to draw urine for culture. Thank you.

## 2020-01-01 NOTE — Telephone Encounter (Signed)
Patient is added to schedule.

## 2020-01-04 ENCOUNTER — Encounter: Payer: Self-pay | Admitting: Obstetrics and Gynecology

## 2020-01-04 DIAGNOSIS — R399 Unspecified symptoms and signs involving the genitourinary system: Secondary | ICD-10-CM

## 2020-01-04 LAB — URINE CULTURE

## 2020-01-06 NOTE — Telephone Encounter (Signed)
Urology ref for recurrent UTI sx with neg C&S. Thx

## 2020-01-09 MED FILL — ESOMEPRAZOLE MAG DR 40 MG C: 40 | 90 days supply | Qty: 90 | Fill #0

## 2020-01-28 ENCOUNTER — Other Ambulatory Visit: Payer: Self-pay

## 2020-01-28 ENCOUNTER — Ambulatory Visit (INDEPENDENT_AMBULATORY_CARE_PROVIDER_SITE_OTHER): Payer: 59 | Admitting: Urology

## 2020-01-28 ENCOUNTER — Encounter: Payer: Self-pay | Admitting: Urology

## 2020-01-28 VITALS — BP 126/85 | HR 77 | Ht 60.0 in | Wt 179.0 lb

## 2020-01-28 DIAGNOSIS — R399 Unspecified symptoms and signs involving the genitourinary system: Secondary | ICD-10-CM | POA: Diagnosis not present

## 2020-01-28 DIAGNOSIS — R102 Pelvic and perineal pain: Secondary | ICD-10-CM | POA: Diagnosis not present

## 2020-01-28 DIAGNOSIS — N390 Urinary tract infection, site not specified: Secondary | ICD-10-CM

## 2020-01-28 LAB — URINALYSIS, COMPLETE
Bilirubin, UA: NEGATIVE
Glucose, UA: NEGATIVE
Ketones, UA: NEGATIVE
Leukocytes,UA: NEGATIVE
Nitrite, UA: NEGATIVE
Protein,UA: NEGATIVE
RBC, UA: NEGATIVE
Specific Gravity, UA: 1.025 (ref 1.005–1.030)
Urobilinogen, Ur: 0.2 mg/dL (ref 0.2–1.0)
pH, UA: 6 (ref 5.0–7.5)

## 2020-01-28 LAB — MICROSCOPIC EXAMINATION: RBC: NONE SEEN /hpf (ref 0–2)

## 2020-01-28 LAB — BLADDER SCAN AMB NON-IMAGING: SCA Result: 0

## 2020-01-28 MED ORDER — TROSPIUM CHLORIDE 20 MG PO TABS: ORAL_TABLET | ORAL | 0 refills | Status: DC

## 2020-01-28 NOTE — Progress Notes (Signed)
01/28/2020 3:07 PM   Elizabeth Day 10-29-1986 [id number]  Referring provider: Chad Cordial, PA-C [address] [place],  [place] [address]  Chief complaint: Urinary symptoms  HPI: Elizabeth Day is a 34 YO female seen in consultation at the request of Elmo Putt Copland for UTI symptoms.  She had onset of intermittent lower urinary tract/pelvic symptoms on 11/06/2019.  She had an ED visit with complaints of lower urinary tract symptoms and was prescribed Keflex for 7 days.  Her symptoms resolved and she had onset of recurrent symptoms 12/8 however her symptoms were primarily bladder pressure, urinary frequency and urgency.  At that visit she was treated with Macrobid. She again had onset of symptoms after hours on 12/27 and was treated again with Macrobid.  She had a urinalysis on 1217 which was negative and a urine culture on 1/12 which grew mixed flora.  She had recurrent symptoms on 1/11 and 2/4.  She treated with Azo and hydration with improvement.  Her symptoms appear to be unrelated to intercourse.  Denies gross hematuria.  Her most bothersome symptom is the bladder pressure.  She was seen by [facility] several years ago and underwent cystoscopy for voiding difficulty.  She also had a urodynamic study.  PMH: Past Medical History:  Diagnosis Date  . Allergic rhinitis   . GERD (gastroesophageal reflux disease)   . History of urinary tract infection     Surgical History: Past Surgical History:  Procedure Laterality Date  . APPENDECTOMY  2003    Home Medications:  Allergies as of 01/28/2020   No Known Allergies     Medication List       Accurate as of January 28, 2020  3:07 PM. If you have any questions, ask your nurse or doctor.        esomeprazole 40 MG capsule Commonly known as: NEXIUM Take 1 capsule (40 mg total) by mouth daily.   fexofenadine 180 MG tablet Commonly known as: ALLEGRA Take by mouth.       Allergies: No Known  Allergies  Family History: Family History  Problem Relation Age of Onset  . Endometriosis Mother   . Fibromyalgia Mother   . Irritable bowel syndrome Mother   . Melanoma Mother   . Hyperlipidemia Father   . Hypertension Father   . Crohn's disease Brother   . Bladder Cancer Maternal Grandmother 28  . Breast cancer Maternal Grandmother 70       has contact  . Hypertension Maternal Grandfather   . Lung cancer Maternal Grandfather 78  . Heart disease Paternal Grandfather   . Hypertension Paternal Grandfather     Social History:  reports that she has been smoking cigarettes. She has been smoking about 0.50 packs per day. She has never used smokeless tobacco. She reports current alcohol use. She reports that she does not use drugs.  ROS: UROLOGY Frequent Urination?: Yes Hard to postpone urination?: No Burning/pain with urination?: No Get up at night to urinate?: No Leakage of urine?: No Urine stream starts and stops?: No Trouble starting stream?: No Do you have to strain to urinate?: Yes Blood in urine?: No Urinary tract infection?: No Sexually transmitted disease?: No Injury to kidneys or bladder?: No Painful intercourse?: No Weak stream?: No Currently pregnant?: No Vaginal bleeding?: No Last menstrual period?: n  Gastrointestinal Nausea?: No Vomiting?: No Indigestion/heartburn?: Yes Diarrhea?: No Constipation?: No  Constitutional Fever: No Night sweats?: No Weight loss?: No Fatigue?: No  Skin Skin rash/lesions?: No Itching?: No  Eyes Blurred vision?: No Double vision?: No  Ears/Nose/Throat Sore throat?: No Sinus problems?: No  Hematologic/Lymphatic Swollen glands?: No Easy bruising?: No  Cardiovascular Leg swelling?: No Chest pain?: No  Respiratory Cough?: No Shortness of breath?: No  Endocrine Excessive thirst?: No  Musculoskeletal Back pain?: No Joint pain?: No  Neurological Headaches?: No Dizziness?:  No  Psychologic Depression?: No Anxiety?: No  Physical Exam: BP 126/85   Pulse 77   Ht 5' (1.524 m)   Wt 179 lb (81.2 kg)   BMI 34.96 kg/m   Constitutional:  Alert and oriented, No acute distress. HEENT: [place] AT, moist mucus membranes.  Trachea midline, no masses. Cardiovascular: No clubbing, cyanosis, or edema. Respiratory: Normal respiratory effort, no increased work of breathing. GI: Abdomen is soft, nontender, nondistended, no abdominal masses GU: No CVA tenderness Lymph: No cervical or inguinal lymphadenopathy. Skin: No rashes, bruises or suspicious lesions. Neurologic: Grossly intact, no focal deficits, moving all 4 extremities. Psychiatric: Normal mood and affect.  Laboratory Data:  Urinalysis Dipstick/microscopy negative   Assessment & Plan:   34 y.o. female with storage related voiding symptoms and bladder pressure.  Will request her previous [facility] records for review.  Rx trospium 20 mg every 12 hours to take as needed for symptom onset.   Will tentatively schedule follow-up with cystoscopy in approximately 4 weeks however will reassess her symptoms prior to cystoscopy.   Return in about 1 month (around 02/25/2020) for Cysto.  Abbie Sons, [place] [address], [place], [place] [address] [phone]

## 2020-01-28 NOTE — Patient Instructions (Signed)

## 2020-01-30 ENCOUNTER — Other Ambulatory Visit: Payer: Self-pay

## 2020-01-30 ENCOUNTER — Encounter: Payer: Self-pay | Admitting: Urology

## 2020-01-30 DIAGNOSIS — R102 Pelvic and perineal pain unspecified side: Secondary | ICD-10-CM | POA: Insufficient documentation

## 2020-01-30 DIAGNOSIS — R399 Unspecified symptoms and signs involving the genitourinary system: Secondary | ICD-10-CM | POA: Insufficient documentation

## 2020-02-20 ENCOUNTER — Encounter: Payer: Self-pay | Admitting: Urology

## 2020-02-21 ENCOUNTER — Other Ambulatory Visit: Payer: Self-pay | Admitting: Urology

## 2020-02-25 ENCOUNTER — Other Ambulatory Visit: Payer: Self-pay

## 2020-02-25 ENCOUNTER — Ambulatory Visit (INDEPENDENT_AMBULATORY_CARE_PROVIDER_SITE_OTHER): Payer: 59 | Admitting: Urology

## 2020-02-25 VITALS — BP 137/85 | HR 80 | Ht 60.0 in | Wt 176.0 lb

## 2020-02-25 DIAGNOSIS — R399 Unspecified symptoms and signs involving the genitourinary system: Secondary | ICD-10-CM | POA: Diagnosis not present

## 2020-02-25 DIAGNOSIS — R339 Retention of urine, unspecified: Secondary | ICD-10-CM

## 2020-02-25 LAB — MICROSCOPIC EXAMINATION: RBC: NONE SEEN /hpf (ref 0–2)

## 2020-02-25 LAB — URINALYSIS, COMPLETE
Bilirubin, UA: NEGATIVE
Glucose, UA: NEGATIVE
Ketones, UA: NEGATIVE
Leukocytes,UA: NEGATIVE
Nitrite, UA: NEGATIVE
Protein,UA: NEGATIVE
RBC, UA: NEGATIVE
Specific Gravity, UA: 1.01 (ref 1.005–1.030)
Urobilinogen, Ur: 0.2 mg/dL (ref 0.2–1.0)
pH, UA: 5.5 (ref 5.0–7.5)

## 2020-02-25 NOTE — Progress Notes (Signed)
   02/25/20  CC:  Chief Complaint  Patient presents with  . Cysto    HPI: No problems since her last visit.  Blood pressure 137/85, pulse 80, height 5' (1.524 m), weight 176 lb (79.8 kg). Normal external genitalia with patent urethral meatus  Cystoscopy Procedure Note  Patient identification was confirmed, informed consent was obtained, and patient was prepped using Betadine solution.  Lidocaine jelly was administered per urethral meatus.    Procedure: - Flexible cystoscope introduced, without any difficulty.   - Thorough search of the bladder revealed:    normal urethral meatus    normal urothelium    no stones    no ulcers     no tumors    no urethral polyps    no trabeculation  - Ureteral orifices were normal in position and appearance.  Post-Procedure: - Patient tolerated the procedure well  Assessment/ Plan: -No mucosal abnormalities -PVR was approximately 150 mL -Records were received from [facility].  She had a urodynamic study which showed a hyposensitive bladder.  She did undergo pelvic floor physical therapy.  Dr. Matilde Sprang had discussed possibility of InterStim if her symptoms progressed.  This was in 2012. -Schedule renal ultrasound for upper tract monitoring.   Abbie Sons, MD

## 2020-02-27 ENCOUNTER — Encounter: Payer: Self-pay | Admitting: Urology

## 2020-02-29 ENCOUNTER — Ambulatory Visit (HOSPITAL_COMMUNITY)
Admission: RE | Admit: 2020-02-29 | Discharge: 2020-02-29 | Disposition: A | Discharge status: Home / Self Care | Payer: 59 | Source: Ambulatory Visit | Attending: Urology | Admitting: Urology

## 2020-02-29 ENCOUNTER — Other Ambulatory Visit: Payer: Self-pay

## 2020-02-29 DIAGNOSIS — R339 Retention of urine, unspecified: Secondary | ICD-10-CM | POA: Diagnosis not present

## 2020-02-29 DIAGNOSIS — R399 Unspecified symptoms and signs involving the genitourinary system: Secondary | ICD-10-CM | POA: Diagnosis not present

## 2020-02-29 DIAGNOSIS — R3914 Feeling of incomplete bladder emptying: Secondary | ICD-10-CM | POA: Diagnosis not present

## 2020-03-03 ENCOUNTER — Telehealth: Payer: Self-pay | Admitting: *Deleted

## 2020-03-03 NOTE — Telephone Encounter (Signed)
-----   Message from Abbie Sons, MD sent at 03/02/2020  9:25 PM EDT ----- RUS was normal

## 2020-03-03 NOTE — Telephone Encounter (Signed)
Left message on vocal mail per [facility]

## 2020-04-10 MED FILL — ESOMEPRAZOLE MAG DR 40 MG C: 40 | 90 days supply | Qty: 90 | Fill #1

## 2020-07-07 MED FILL — ESOMEPRAZOLE MAG DR 40 MG C: 40 | 90 days supply | Qty: 90 | Fill #2

## 2020-07-24 ENCOUNTER — Other Ambulatory Visit: Payer: Self-pay

## 2020-07-24 ENCOUNTER — Ambulatory Visit
Admission: EM | Admit: 2020-07-24 | Discharge: 2020-07-24 | Disposition: A | Discharge status: Home / Self Care | Payer: 59 | Attending: Family Medicine | Admitting: Family Medicine

## 2020-07-24 DIAGNOSIS — R21 Rash and other nonspecific skin eruption: Secondary | ICD-10-CM

## 2020-07-24 MED ORDER — PREDNISONE 10 MG (21) PO TBPK: ORAL_TABLET | ORAL | 0 refills | Status: DC

## 2020-07-24 NOTE — ED Triage Notes (Addendum)
Pt reports an itchy rash started on back of her legs last night. Now has spread to front of upper legs and onto abdomen. No recent changes in detergents. Reports she ate a peach yesterday which she hasn't in a long time but nothing else she can think of. Denies working in yard or other outdoor exposure.

## 2020-07-24 NOTE — Discharge Instructions (Signed)
Daily antihistamine.  Benadryl as needed for itching.  Steroid as prescribed.  Take care  Dr. Lacinda Axon

## 2020-07-24 NOTE — ED Provider Notes (Signed)
[facility]    CSN: [id number] Arrival date & time: 07/24/20  1109      History   Chief Complaint Chief Complaint  Patient presents with  . Rash   HPI  34 year old female presents with rash.  Started last night.  Started on the backs of the legs.  Has now spread upward.  Affects the abdomen and upper legs.  Itchy.  No new changes or exposures that she is aware of.  She does note that she ate a peach yesterday but does not feel like this is out of the norm.  She has taken Benadryl and used hydrocortisone with some improvement today.  No other associated symptoms.  No other complaints.  Past Medical History:  Diagnosis Date  . Allergic rhinitis   . GERD (gastroesophageal reflux disease)   . History of urinary tract infection     Patient Active Problem List   Diagnosis Date Noted  . Lower urinary tract symptoms 01/30/2020  . Pelvic pressure in female 01/30/2020  . Gastroesophageal reflux disease without esophagitis 10/17/2019  . Tobacco use 09/20/2018    Past Surgical History:  Procedure Laterality Date  . APPENDECTOMY  2003    OB History    Gravida  0   Para  0   Term  0   Preterm  0   AB  0   Living  0     SAB  0   TAB  0   Ectopic  0   Multiple  0   Live Births  0            Home Medications    Prior to Admission medications   Medication Sig Start Date End Date Taking? Authorizing Provider  fluticasone (FLONASE) 50 MCG/ACT nasal spray Place into both nostrils daily.   Yes [provider]  esomeprazole (NEXIUM) 40 MG capsule Take 1 capsule (40 mg total) by mouth daily. 03/88/82   Copland, Deirdre Evener, PA-C  fexofenadine (ALLEGRA) 180 MG tablet Take by mouth.    [provider]  predniSONE (STERAPRED UNI-PAK 21 TAB) 10 MG (21) TBPK tablet 6 tablets on day 1; decrease by 1 tablet daily until gone. 07/24/20   Coral Spikes, DO  trospium (SANCTURA) 20 MG tablet 1 TAB TWICE DAILY AS NEEDED FOR BLADDER SYMPTOMS 02/21/20    Stoioff, Ronda Fairly, MD    Family History Family History  Problem Relation Age of Onset  . Endometriosis Mother   . Fibromyalgia Mother   . Irritable bowel syndrome Mother   . Melanoma Mother   . Hyperlipidemia Father   . Hypertension Father   . Crohn's disease Brother   . Bladder Cancer Maternal Grandmother 19  . Breast cancer Maternal Grandmother 70       has contact  . Hypertension Maternal Grandfather   . Lung cancer Maternal Grandfather 58  . Heart disease Paternal Grandfather   . Hypertension Paternal Grandfather     Social History Social History   Tobacco Use  . Smoking status: Current Every Day Smoker    Packs/day: 0.50    Types: Cigarettes  . Smokeless tobacco: Never Used  Vaping Use  . Vaping Use: Never used  Substance Use Topics  . Alcohol use: Yes    Comment: weekly  . Drug use: No     Allergies   Patient has no known allergies.   Review of Systems Review of Systems  Constitutional: Negative.   Skin: Positive for rash.   Physical  Exam Triage Vital Signs ED Triage Vitals  Enc Vitals Group     BP 07/24/20 1153 (!) 148/97     Pulse Rate 07/24/20 1153 81     Resp 07/24/20 1153 16     Temp 07/24/20 1153 98.2 F (36.8 C)     Temp Source 07/24/20 1153 Oral     SpO2 07/24/20 1153 100 %     Weight 07/24/20 1150 177 lb (80.3 kg)     Height 07/24/20 1150 5' 0.75" (1.543 m)     Head Circumference --      Peak Flow --      Pain Score 07/24/20 1149 0     Pain Loc --      Pain Edu? --      Excl. in [place]? --    Updated Vital Signs BP (!) 148/97 (BP Location: Left Arm)   Pulse 81   Temp 98.2 F (36.8 C) (Oral)   Resp 16   Ht 5' 0.75" (1.543 m)   Wt 80.3 kg   LMP 07/03/2020   SpO2 100%   BMI 33.72 kg/m   Visual Acuity Right Eye Distance:   Left Eye Distance:   Bilateral Distance:    Right Eye Near:   Left Eye Near:    Bilateral Near:     Physical Exam Constitutional:      General: She is not in acute distress.    Appearance: Normal  appearance. She is not ill-appearing.  HENT:     Head: Normocephalic and atraumatic.  Eyes:     General:        Right eye: No discharge.        Left eye: No discharge.     Conjunctiva/sclera: Conjunctivae normal.  Pulmonary:     Effort: Pulmonary effort is normal. No respiratory distress.  Skin:    Comments: Lower abdomen with area of erythema.  There is also some mild erythema in the posterior aspect of the right leg just below the buttock.  Neurological:     Mental Status: She is alert.  Psychiatric:        Mood and Affect: Mood normal.        Behavior: Behavior normal.    UC Treatments / Results  Labs (all labs ordered are listed, but only abnormal results are displayed) Labs Reviewed - No data to display  EKG   Radiology No results found.  Procedures Procedures (including critical care time)  Medications Ordered in UC Medications - No data to display  Initial Impression / Assessment and Plan / UC Course  I have reviewed the triage vital signs and the nursing notes.  Pertinent labs & imaging results that were available during my care of the patient were reviewed by me and considered in my medical decision making (see chart for details).    34 year old female presents with rash.  Suspect contact or allergic in nature.  Advised daily antihistamine and Benadryl as needed.  Patient will use a steroid taper if she fails to improve with the directed treatment.  Final Clinical Impressions(s) / UC Diagnoses   Final diagnoses:  Rash     Discharge Instructions     Daily antihistamine.  Benadryl as needed for itching.  Steroid as prescribed.  Take care  Dr. Lacinda Axon    ED Prescriptions    Medication Sig Dispense Auth. Provider   predniSONE (STERAPRED UNI-PAK 21 TAB) 10 MG (21) TBPK tablet 6 tablets on day 1; decrease by 1 tablet daily until  gone. 21 tablet [place], [place], [place]     PDMP not reviewed this encounter.   Coral Spikes, [place] 07/24/20 1238

## 2020-07-30 ENCOUNTER — Encounter: Payer: Self-pay | Admitting: Urology

## 2020-10-07 MED FILL — ESOMEPRAZOLE MAG DR 40 MG C: 40 | 90 days supply | Qty: 90 | Fill #3

## 2020-10-28 ENCOUNTER — Other Ambulatory Visit: Payer: Self-pay | Admitting: Obstetrics and Gynecology

## 2020-10-28 ENCOUNTER — Encounter: Payer: Self-pay | Admitting: Obstetrics and Gynecology

## 2020-10-28 ENCOUNTER — Other Ambulatory Visit: Payer: Self-pay

## 2020-10-28 ENCOUNTER — Other Ambulatory Visit (HOSPITAL_COMMUNITY)
Admission: RE | Admit: 2020-10-28 | Discharge: 2020-10-28 | Disposition: A | Discharge status: Home / Self Care | Payer: 59 | Source: Ambulatory Visit | Attending: Obstetrics and Gynecology | Admitting: Obstetrics and Gynecology

## 2020-10-28 ENCOUNTER — Ambulatory Visit (INDEPENDENT_AMBULATORY_CARE_PROVIDER_SITE_OTHER): Payer: 59 | Admitting: Obstetrics and Gynecology

## 2020-10-28 VITALS — BP 134/80 | Ht 63.75 in | Wt 184.0 lb

## 2020-10-28 DIAGNOSIS — K219 Gastro-esophageal reflux disease without esophagitis: Secondary | ICD-10-CM | POA: Diagnosis not present

## 2020-10-28 DIAGNOSIS — Z01419 Encounter for gynecological examination (general) (routine) without abnormal findings: Secondary | ICD-10-CM | POA: Diagnosis not present

## 2020-10-28 DIAGNOSIS — Z113 Encounter for screening for infections with a predominantly sexual mode of transmission: Secondary | ICD-10-CM

## 2020-10-28 MED ORDER — ESOMEPRAZOLE MAGNESIUM 40 MG PO CPDR: 40.0000 mg | DELAYED_RELEASE_CAPSULE | Freq: Every day | ORAL | 3 refills | Status: DC

## 2020-10-28 NOTE — Patient Instructions (Signed)
I value your feedback and entrusting us with your care. If you get a [facility] patient survey, I would appreciate you taking the time to let us know about your experience today. Thank you!  As of November 29, 2019, your lab results will be released to your MyChart immediately, before I even have a chance to see them. Please give me time to review them and contact you if there are any abnormalities. Thank you for your patience.  

## 2020-10-28 NOTE — Progress Notes (Signed)
PCP:  Patient, No Pcp Per   Chief Complaint  Patient presents with  . Gynecologic Exam     HPI:      Elizabeth Day is a 34 y.o. G0P0000 who LMP was Patient's last menstrual period was 10/21/2020 (exact date)., presents today for her annual examination.  Her menses are regular every 28-30 days, lasting 5-7 days.  Dysmenorrhea mild, occurring first 1-2 days of flow. She does not have intermenstrual bleeding.  Sex activity: single partner, contraception - none. Declines BC, feels better off OCPs.  Hx of infertility in past with ex-husband. Pt had ovulation with serum progesterone at that time. Has had issues with "squirting" past 6-8 months. No dyspareunia/dryness. Empties bladder beforehand so not urine incont.  Last Pap: 09/20/18  Results were: no abnormalities /neg HPV DNA Hx of STDs: none; would like STD testing today. Neg STD testing 1/21  Was having episodes of UTIs sx/dysuria earlier this yr and sent to urology. Diagnosed with bladder spasms after multiple neg C&S. Sx improved with bladder spasm med prn.   There is a FH of breast cancer in her MGM, genetic testing not indicated. There is no FH of ovarian cancer. The patient does not do self-breast exams.  Tobacco use: 3/4 ppd, used to be 1/2 ppd Alcohol use: none No drug use.  Exercise: not active  She does not get adequate calcium and Vitamin D in her diet.  She has a hx of GERD and takes nexium daily with sx relief.  She needs Rx RF.   Past Medical History:  Diagnosis Date  . Allergic rhinitis   . GERD (gastroesophageal reflux disease)   . History of urinary tract infection     Past Surgical History:  Procedure Laterality Date  . APPENDECTOMY  2003    Family History  Problem Relation Age of Onset  . Endometriosis Mother   . Fibromyalgia Mother   . Irritable bowel syndrome Mother   . Melanoma Mother   . Hyperlipidemia Father   . Hypertension Father   . Crohn's disease Brother   . Bladder Cancer  Maternal Grandmother 70  . Breast cancer Maternal Grandmother 70       has contact  . Hypertension Maternal Grandfather   . Lung cancer Maternal Grandfather 26  . Heart disease Paternal Grandfather   . Hypertension Paternal Grandfather     Social History   Socioeconomic History  . Marital status: Legally Separated    Spouse name: Not on file  . Number of children: Not on file  . Years of education: Not on file  . Highest education level: Not on file  Occupational History  . Not on file  Tobacco Use  . Smoking status: Current Every Day Smoker    Packs/day: 0.50    Types: Cigarettes  . Smokeless tobacco: Never Used  Vaping Use  . Vaping Use: Never used  Substance and Sexual Activity  . Alcohol use: Yes    Comment: weekly  . Drug use: No  . Sexual activity: Yes    Birth control/protection: None  Other Topics Concern  . Not on file  Social History Narrative  . Not on file   Social Determinants of Health   Financial Resource Strain:   . Difficulty of Paying Living Expenses: Not on file  Food Insecurity:   . Worried About Charity fundraiser in the Last Year: Not on file  . Ran Out of Food in the Last Year: Not on file  Transportation Needs:   . Film/video editor (Medical): Not on file  . Lack of Transportation (Non-Medical): Not on file  Physical Activity:   . Days of Exercise per Week: Not on file  . Minutes of Exercise per Session: Not on file  Stress:   . Feeling of Stress : Not on file  Social Connections:   . Frequency of Communication with Friends and Family: Not on file  . Frequency of Social Gatherings with Friends and Family: Not on file  . Attends Religious Services: Not on file  . Active Member of Clubs or Organizations: Not on file  . Attends Archivist Meetings: Not on file  . Marital Status: Not on file  Intimate Partner Violence:   . Fear of Current or Ex-Partner: Not on file  . Emotionally Abused: Not on file  . Physically  Abused: Not on file  . Sexually Abused: Not on file    Current Meds  Medication Sig  . esomeprazole (NEXIUM) 40 MG capsule Take 1 capsule (40 mg total) by mouth daily.  . fexofenadine (ALLEGRA) 180 MG tablet Take by mouth.  . fluticasone (FLONASE) 50 MCG/ACT nasal spray Place into both nostrils daily.  . trospium (SANCTURA) 20 MG tablet 1 TAB TWICE DAILY AS NEEDED FOR BLADDER SYMPTOMS  . [DISCONTINUED] esomeprazole (NEXIUM) 40 MG capsule Take 1 capsule (40 mg total) by mouth daily.     ROS:  Review of Systems  Constitutional: Negative for fatigue, fever and unexpected weight change.  Respiratory: Negative for cough, shortness of breath and wheezing.   Cardiovascular: Negative for chest pain, palpitations and leg swelling.  Gastrointestinal: Negative for blood in stool, constipation, diarrhea, nausea and vomiting.  Endocrine: Negative for cold intolerance, heat intolerance and polyuria.  Genitourinary: Negative for dyspareunia, dysuria, flank pain, frequency, genital sores, hematuria, menstrual problem, pelvic pain, urgency, vaginal bleeding, vaginal discharge and vaginal pain.  Musculoskeletal: Negative for back pain, joint swelling and myalgias.  Skin: Negative for rash.  Neurological: Negative for dizziness, syncope, light-headedness, numbness and headaches.  Hematological: Negative for adenopathy.  Psychiatric/Behavioral: Negative for agitation, confusion, sleep disturbance and suicidal ideas. The patient is not nervous/anxious.      Objective: BP 134/80   Ht 5' 3.75" (1.619 m)   Wt 184 lb (83.5 kg)   LMP 10/21/2020 (Exact Date)   BMI 31.83 kg/m    Physical Exam Constitutional:      Appearance: She is well-developed.  Genitourinary:     Vulva, vagina, uterus, right adnexa and left adnexa normal.     No vulval lesion or tenderness noted.     No vaginal discharge, erythema or tenderness.     No cervical motion tenderness or polyp.     Uterus is not enlarged or tender.      No right or left adnexal mass present.     Right adnexa not tender.     Left adnexa not tender.  Neck:     Thyroid: No thyromegaly.  Cardiovascular:     Rate and Rhythm: Normal rate and regular rhythm.     Heart sounds: Normal heart sounds. No murmur heard.   Pulmonary:     Effort: Pulmonary effort is normal.     Breath sounds: Normal breath sounds.  Chest:     Breasts:        Right: No mass, nipple discharge, skin change or tenderness.        Left: No mass, nipple discharge, skin change or tenderness.  Abdominal:  Palpations: Abdomen is soft.     Tenderness: There is no abdominal tenderness. There is no guarding.  Musculoskeletal:        General: Normal range of motion.     Cervical back: Normal range of motion.  Neurological:     General: No focal deficit present.     Mental Status: She is alert and oriented to person, place, and time.     Cranial Nerves: No cranial nerve deficit.  Skin:    General: Skin is warm and dry.  Psychiatric:        Mood and Affect: Mood normal.        Behavior: Behavior normal.        Thought Content: Thought content normal.        Judgment: Judgment normal.  Vitals reviewed.     Assessment/Plan: Encounter for annual routine gynecological examination  Screening for STD (sexually transmitted disease) - Plan: Cervicovaginal ancillary only  Gastroesophageal reflux disease without esophagitis - Plan: esomeprazole (NEXIUM) 40 MG capsule; Rx RF    Meds ordered this encounter  Medications  . esomeprazole (NEXIUM) 40 MG capsule    Sig: Take 1 capsule (40 mg total) by mouth daily.    Dispense:  90 capsule    Refill:  3    Order Specific Question:   Supervising Provider    Answer:   Gae Dry [[id number]]            GYN counsel adequate intake of calcium and vitamin D, diet and exercise     F/U  Return in about 1 year (around 10/28/2021).  Rhianne Soman B. Tilly Pernice, PA-C 10/28/2020 2:37 PM

## 2020-10-30 LAB — CERVICOVAGINAL ANCILLARY ONLY
Chlamydia: NEGATIVE
Comment: NEGATIVE
Comment: NORMAL
Neisseria Gonorrhea: NEGATIVE

## 2021-01-05 MED FILL — ESOMEPRAZOLE MAG DR 40 MG C: 40 | 90 days supply | Qty: 90 | Fill #0

## 2021-03-11 ENCOUNTER — Ambulatory Visit (INDEPENDENT_AMBULATORY_CARE_PROVIDER_SITE_OTHER): Payer: No Typology Code available for payment source | Admitting: Internal Medicine

## 2021-03-11 ENCOUNTER — Encounter: Payer: Self-pay | Admitting: Internal Medicine

## 2021-03-11 ENCOUNTER — Other Ambulatory Visit: Payer: Self-pay

## 2021-03-11 VITALS — BP 102/72 | HR 95 | Temp 98.0°F | Ht 60.0 in | Wt 190.0 lb

## 2021-03-11 DIAGNOSIS — Z720 Tobacco use: Secondary | ICD-10-CM | POA: Diagnosis not present

## 2021-03-11 DIAGNOSIS — N3289 Other specified disorders of bladder: Secondary | ICD-10-CM | POA: Insufficient documentation

## 2021-03-11 DIAGNOSIS — J3089 Other allergic rhinitis: Secondary | ICD-10-CM | POA: Diagnosis not present

## 2021-03-11 DIAGNOSIS — D485 Neoplasm of uncertain behavior of skin: Secondary | ICD-10-CM

## 2021-03-11 DIAGNOSIS — Z0000 Encounter for general adult medical examination without abnormal findings: Secondary | ICD-10-CM

## 2021-03-11 DIAGNOSIS — K219 Gastro-esophageal reflux disease without esophagitis: Secondary | ICD-10-CM | POA: Diagnosis not present

## 2021-03-11 NOTE — Progress Notes (Signed)
Date:  03/11/2021   Name:  Elizabeth Day   DOB:  07-25-1986   MRN:  [id number]   Chief Complaint: [place] is a 35 y.o. female who presents today for her Complete Annual Exam. She feels well. She reports exercising regularly. She reports she is sleeping fairly well. Breast complaints -none.  She has a GYN - Mammogram last year and Pap in 2019.  Pap smear: 09/2018 neg with cotesting   Immunization History  Administered Date(s) Administered  . Influenza-Unspecified 09/08/2018, 10/15/2020  . Tdap 01/21/2020  She is exempt from Covid-19 vaccine on religious grounds.  Gastroesophageal Reflux She complains of heartburn. She reports no abdominal pain, no chest pain, no coughing or no wheezing. This is a recurrent problem. The problem occurs occasionally. Pertinent negatives include no fatigue. She has tried a PPI for the symptoms. The treatment provided significant relief. Past procedures do not include an EGD or H. pylori antibody titer.      Review of Systems  Constitutional: Negative for chills, fatigue and fever.  HENT: Negative for congestion, hearing loss, tinnitus, trouble swallowing and voice change.   Eyes: Negative for visual disturbance.  Respiratory: Negative for cough, chest tightness, shortness of breath and wheezing.   Cardiovascular: Negative for chest pain, palpitations and leg swelling.  Gastrointestinal: Positive for heartburn. Negative for abdominal pain, constipation, diarrhea and vomiting.       Gerd well controlled with daily nexium  Endocrine: Negative for polydipsia and polyuria.  Genitourinary: Positive for urgency (at times due to bladder spasms). Negative for dysuria, frequency, genital sores, vaginal bleeding and vaginal discharge.  Musculoskeletal: Negative for arthralgias, gait problem and joint swelling.  Skin: Negative for color change and rash.  Allergic/Immunologic: Positive for environmental allergies.  Neurological:  Negative for dizziness, tremors, light-headedness and headaches.  Hematological: Negative for adenopathy. Does not bruise/bleed easily.  Psychiatric/Behavioral: Negative for dysphoric mood and sleep disturbance. The patient is not nervous/anxious.     Patient Active Problem List   Diagnosis Date Noted  . Bladder spasms 03/11/2021  . Lower urinary tract symptoms 01/30/2020  . Pelvic pressure in female 01/30/2020  . Gastroesophageal reflux disease without esophagitis 10/17/2019  . Tobacco use 09/20/2018    No Known Allergies  Past Surgical History:  Procedure Laterality Date  . APPENDECTOMY  2003  . WISDOM TOOTH EXTRACTION  2005    Social History   Tobacco Use  . Smoking status: Current Every Day Smoker    Packs/day: 0.50    Years: 19.00    Pack years: 9.50    Types: Cigarettes    Start date: 12/20/2004  . Smokeless tobacco: Never Used  Vaping Use  . Vaping Use: Never used  Substance Use Topics  . Alcohol use: Yes    Comment: weekly  . Drug use: No     Medication list has been reviewed and updated.  Current Meds  Medication Sig  . esomeprazole (NEXIUM) 40 MG capsule Take 1 capsule (40 mg total) by mouth daily.  . fexofenadine (ALLEGRA) 180 MG tablet Take by mouth as needed.  . fluticasone (FLONASE) 50 MCG/ACT nasal spray Place into both nostrils as needed.  . trospium (SANCTURA) 20 MG tablet 1 TAB TWICE DAILY AS NEEDED FOR BLADDER SYMPTOMS    PHQ 2/9 Scores 03/11/2021  PHQ - 2 Score 0  PHQ- 9 Score 4    GAD 7 : Generalized Anxiety Score 03/11/2021  Nervous, Anxious, on Edge 0  Control/stop worrying 0  Worry too much - different things 0  Trouble relaxing 0  Restless 0  Easily annoyed or irritable 0  Afraid - awful might happen 0  Total GAD 7 Score 0    BP Readings from Last 3 Encounters:  03/11/21 102/72  10/28/20 134/80  07/24/20 (!) 148/97    Physical Exam Vitals and nursing note reviewed.  Constitutional:      General: She is not in acute  distress.    Appearance: She is well-developed.  HENT:     Head: Normocephalic and atraumatic.     Right Ear: Tympanic membrane and ear canal normal.     Left Ear: Tympanic membrane and ear canal normal.  Eyes:     General: No scleral icterus.       Right eye: No discharge.        Left eye: No discharge.     Conjunctiva/sclera: Conjunctivae normal.  Neck:     Thyroid: No thyromegaly.     Vascular: No carotid bruit.  Cardiovascular:     Rate and Rhythm: Normal rate and regular rhythm.     Pulses: Normal pulses.     Heart sounds: Normal heart sounds.  Pulmonary:     Effort: Pulmonary effort is normal. No respiratory distress.     Breath sounds: No wheezing.  Chest:     Comments: Breast exam deferred Abdominal:     General: Bowel sounds are normal.     Palpations: Abdomen is soft.     Tenderness: There is no abdominal tenderness.  Musculoskeletal:     Cervical back: Normal range of motion. No erythema.     Right lower leg: No edema.     Left lower leg: No edema.  Lymphadenopathy:     Cervical: No cervical adenopathy.  Skin:    General: Skin is warm and dry.     Findings: No rash.     Comments: Multiple varied nevi  Neurological:     Mental Status: She is alert and oriented to person, place, and time.     Cranial Nerves: No cranial nerve deficit.     Sensory: No sensory deficit.     Deep Tendon Reflexes: Reflexes are normal and symmetric.  Psychiatric:        Attention and Perception: Attention normal.        Mood and Affect: Mood normal.     Wt Readings from Last 3 Encounters:  03/11/21 190 lb (86.2 kg)  10/28/20 184 lb (83.5 kg)  07/24/20 177 lb (80.3 kg)    BP 102/72   Pulse 95   Temp 98 F (36.7 C) (Oral)   Ht 5' (1.524 m)   Wt 190 lb (86.2 kg)   LMP 02/25/2021 (Approximate)   SpO2 100%   BMI 37.11 kg/m   Assessment and Plan: 1. Annual physical exam Normal exam except for weight - continue to work on diet changes, regular exercise recommended -  Comprehensive metabolic panel - Lipid panel - TSH - Vitamin B12  2. Bladder spasms Treated with PRN [place]  3. Gastroesophageal reflux disease without esophagitis Symptoms well controlled on daily PPI No red flag signs such as weight loss, n/v, melena Will continue Nexium. - CBC with Differential/Platelet  4. Neoplasm of uncertain behavior of skin Recommend Dermatology evaluation/skin survey - Ambulatory referral to Dermatology  5. Tobacco use Counseling provided   Partially dictated using Editor, commissioning. Any errors are unintentional.  Halina Maidens, MD [facility]  03/11/2021

## 2021-03-18 ENCOUNTER — Telehealth: Payer: Self-pay

## 2021-03-18 NOTE — Telephone Encounter (Signed)
Called and left VM for patient to remind her to go get her labs drawn from her recent appt.  Told her to call back with any questions or concerns.

## 2021-03-20 LAB — COMPREHENSIVE METABOLIC PANEL
ALT: 16 IU/L (ref 0–32)
AST: 21 IU/L (ref 0–40)
Albumin/Globulin Ratio: 2 (ref 1.2–2.2)
Albumin: 4.3 g/dL (ref 3.8–4.8)
Alkaline Phosphatase: 67 IU/L (ref 44–121)
BUN/Creatinine Ratio: 17 (ref 9–23)
BUN: 13 mg/dL (ref 6–20)
Bilirubin Total: 0.4 mg/dL (ref 0.0–1.2)
CO2: 21 mmol/L (ref 20–29)
Calcium: 9.2 mg/dL (ref 8.7–10.2)
Chloride: 105 mmol/L (ref 96–106)
Creatinine, Ser: 0.75 mg/dL (ref 0.57–1.00)
Globulin, Total: 2.2 g/dL (ref 1.5–4.5)
Glucose: 93 mg/dL (ref 65–99)
Potassium: 4.5 mmol/L (ref 3.5–5.2)
Sodium: 140 mmol/L (ref 134–144)
Total Protein: 6.5 g/dL (ref 6.0–8.5)
eGFR: 106 mL/min/{1.73_m2} (ref >59)

## 2021-03-20 LAB — CBC WITH DIFFERENTIAL/PLATELET
Basophils Absolute: 0 10*3/uL (ref 0.0–0.2)
Basos: 1 %
EOS (ABSOLUTE): 0.2 10*3/uL (ref 0.0–0.4)
Eos: 2 %
Hematocrit: 40 % (ref 34.0–46.6)
Hemoglobin: 13.7 g/dL (ref 11.1–15.9)
Immature Grans (Abs): 0 10*3/uL (ref 0.0–0.1)
Immature Granulocytes: 0 %
Lymphocytes Absolute: 3.9 10*3/uL — ABNORMAL HIGH (ref 0.7–3.1)
Lymphs: 46 %
MCH: 34.5 pg — ABNORMAL HIGH (ref 26.6–33.0)
MCHC: 34.3 g/dL (ref 31.5–35.7)
MCV: 101 fL — ABNORMAL HIGH (ref 79–97)
Monocytes Absolute: 0.7 10*3/uL (ref 0.1–0.9)
Monocytes: 9 %
Neutrophils Absolute: 3.5 10*3/uL (ref 1.4–7.0)
Neutrophils: 42 %
Platelets: 221 10*3/uL (ref 150–450)
RBC: 3.97 x10E6/uL (ref 3.77–5.28)
RDW: 12.3 % (ref 11.7–15.4)
WBC: 8.4 10*3/uL (ref 3.4–10.8)

## 2021-03-20 LAB — LIPID PANEL
Chol/HDL Ratio: 1.9 ratio (ref 0.0–4.4)
Cholesterol, Total: 188 mg/dL (ref 100–199)
HDL: 101 mg/dL (ref >39)
LDL Chol Calc (NIH): 79 mg/dL (ref 0–99)
Triglycerides: 40 mg/dL (ref 0–149)
VLDL Cholesterol Cal: 8 mg/dL (ref 5–40)

## 2021-03-20 LAB — TSH: TSH: 2.72 u[IU]/mL (ref 0.450–4.500)

## 2021-03-20 LAB — VITAMIN B12: Vitamin B-12: 329 pg/mL (ref 232–1245)

## 2021-03-20 NOTE — Progress Notes (Signed)
Patient notified and verbalized understanding.  No further questions at this time.

## 2021-04-06 ENCOUNTER — Other Ambulatory Visit (HOSPITAL_COMMUNITY): Payer: Self-pay

## 2021-04-06 MED FILL — Esomeprazole Magnesium Cap Delayed Release 40 MG (Base Eq): ORAL | 90 days supply | Qty: 90 | Fill #0 | Status: AC

## 2021-06-26 ENCOUNTER — Other Ambulatory Visit (HOSPITAL_COMMUNITY): Payer: Self-pay

## 2021-06-26 MED FILL — Esomeprazole Magnesium Cap Delayed Release 40 MG (Base Eq): ORAL | 90 days supply | Qty: 90 | Fill #1 | Status: AC

## 2021-09-30 ENCOUNTER — Other Ambulatory Visit (HOSPITAL_COMMUNITY): Payer: Self-pay

## 2021-09-30 MED FILL — Esomeprazole Magnesium Cap Delayed Release 40 MG (Base Eq): ORAL | 90 days supply | Qty: 90 | Fill #2 | Status: AC

## 2021-12-03 IMAGING — US US RENAL
2 series · 14 of 25 positions shown · non-contrast
Comparison: None.

CLINICAL DATA: Initial evaluation for incomplete bladder emptying.

EXAM:
RENAL / URINARY TRACT ULTRASOUND COMPLETE

[Series 1: us renal · 13 of 27 slices shown (1 of 2)]
[im 1/27]
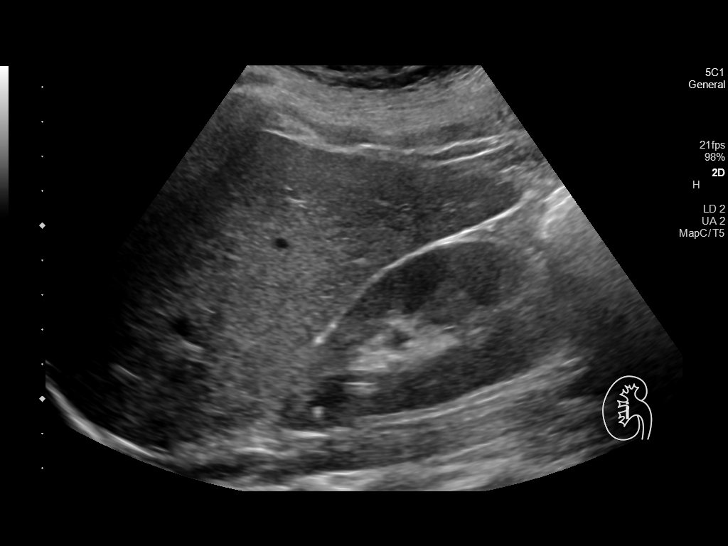
[im 3/27]
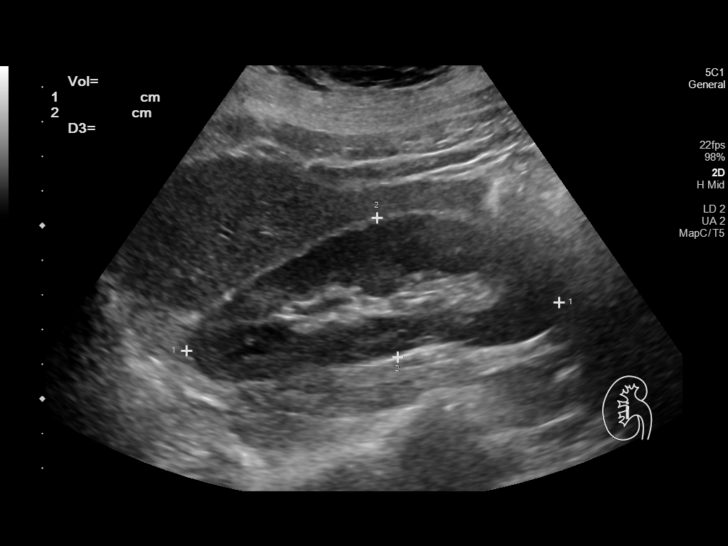
[im 5/27]
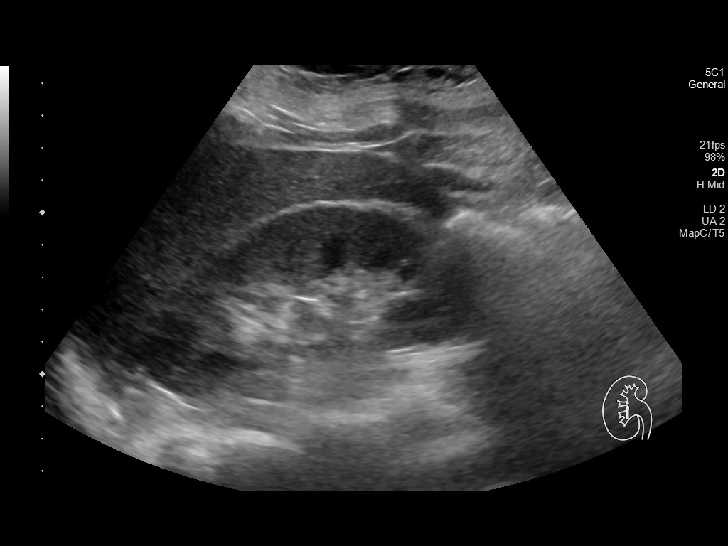
[im 8/27]
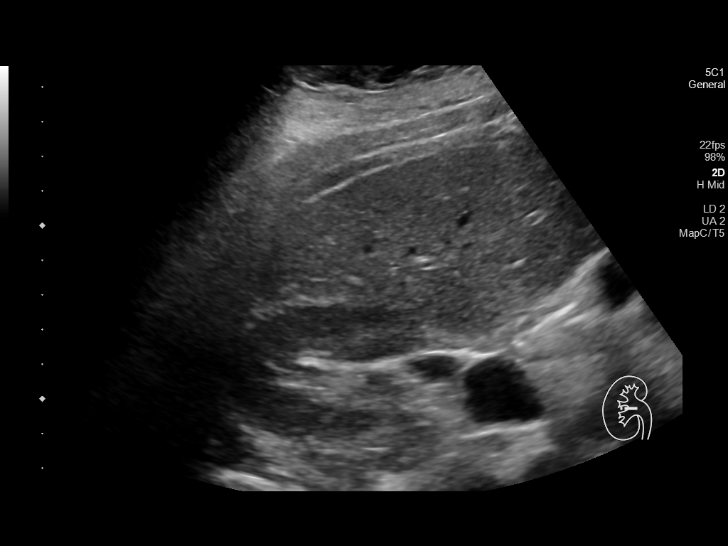
[im 10/27]
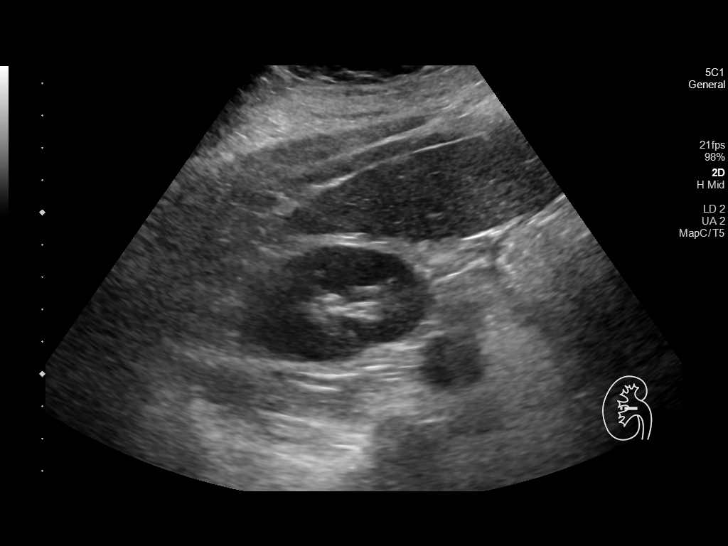
[im 11/27]
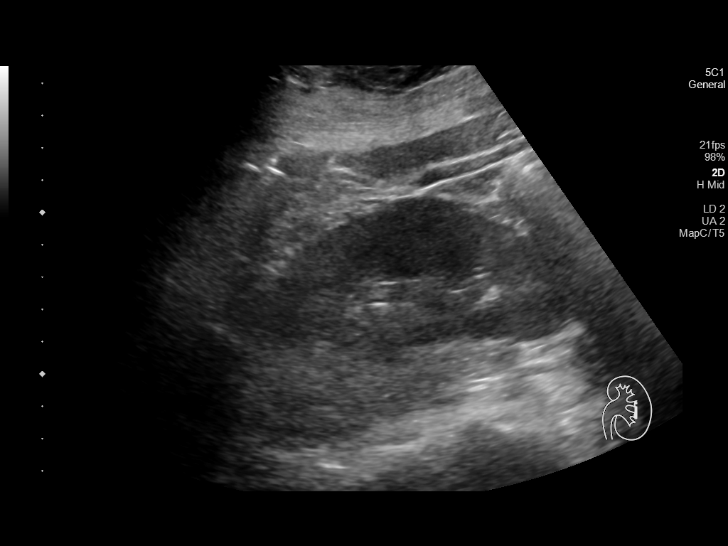
[im 14/27]
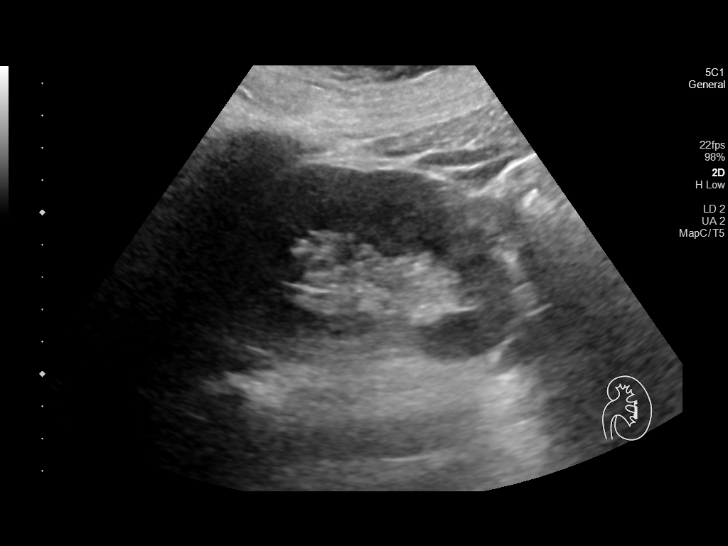
[im 16/27]
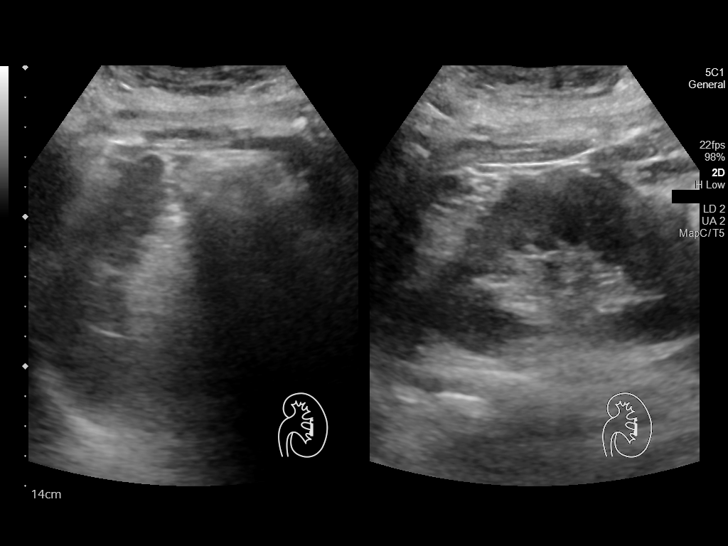
[im 18/27]
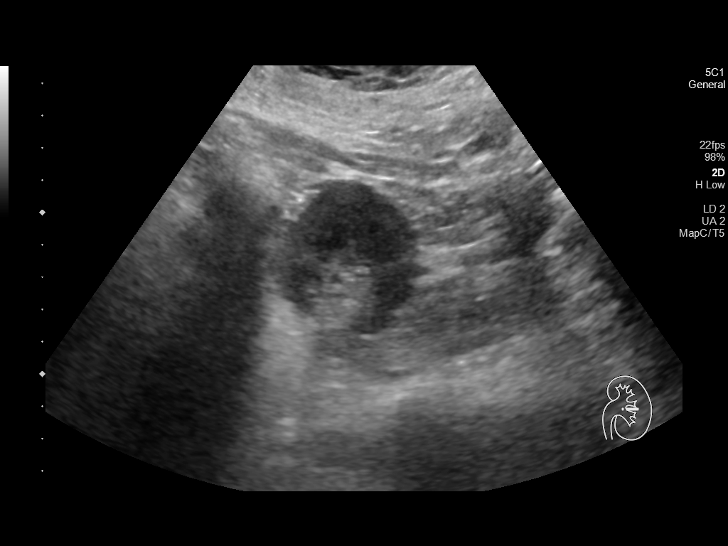
[im 19/27]
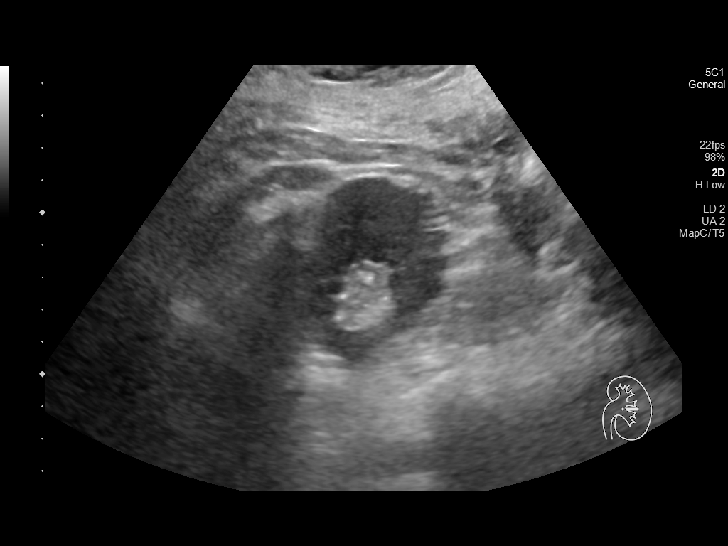
[im 22/27]
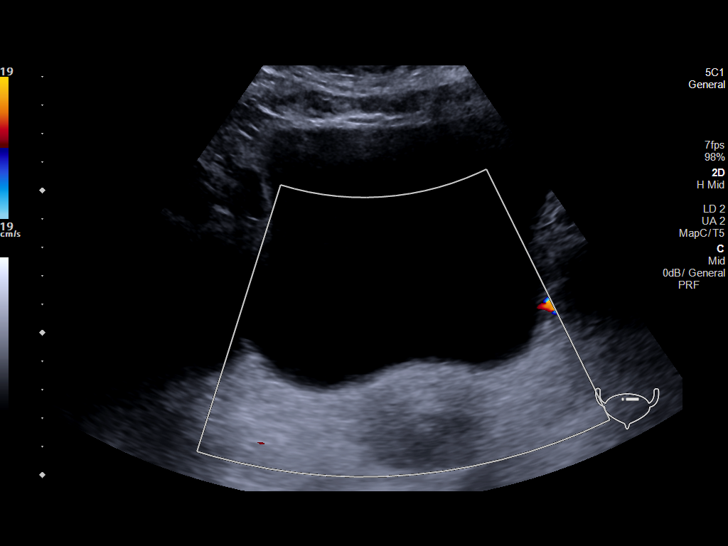
[im 24/27]
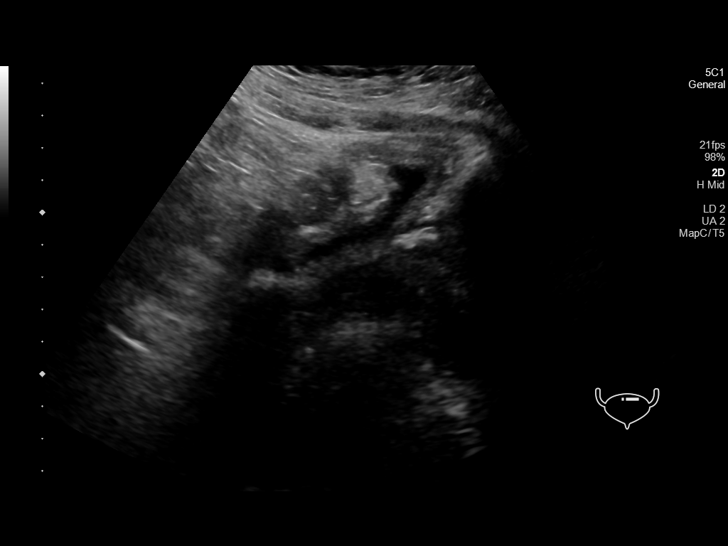
[im 27/27]
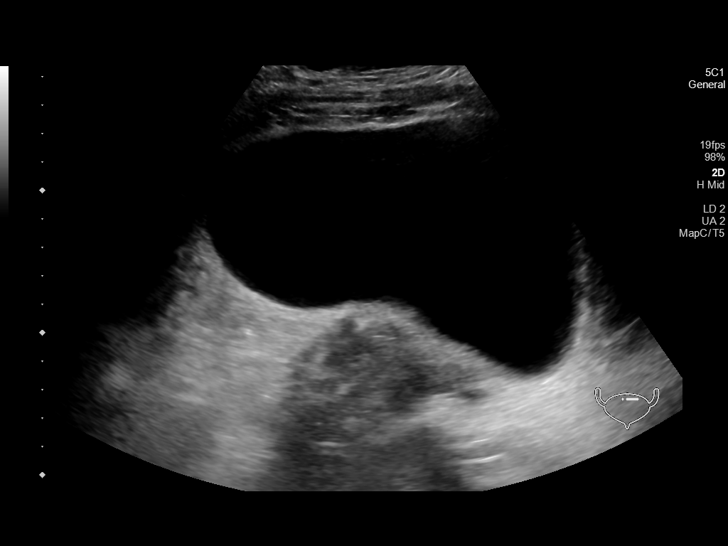

[Series 2: us renal · 1 of 2 slices shown (2 of 2)]
[im 2/2]
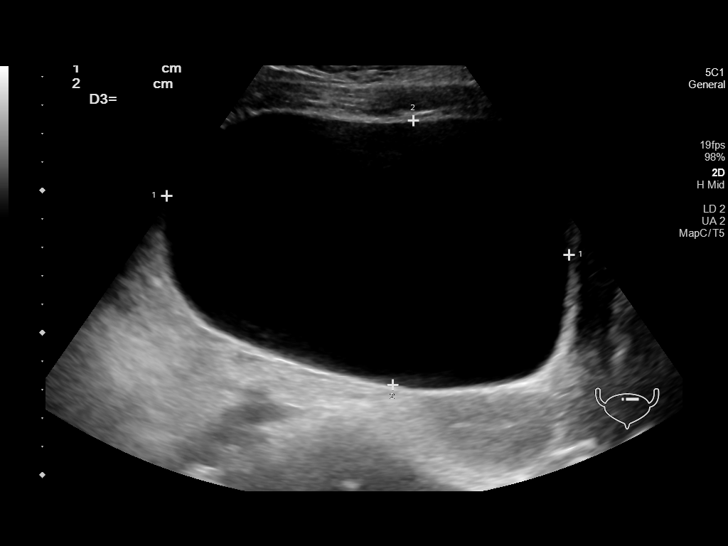

[14 of 25 positions shown; findings below may reference images not displayed]

FINDINGS: Right Kidney:

Renal measurements: 10.8 x 4.1 x 5.4 cm = volume: 125.1 mL.
Echogenicity within normal limits. No nephrolithiasis or
hydronephrosis. No focal renal mass.

Left Kidney:

Renal measurements: 10.1 x 5.2 x 4.4 cm = volume: 119.1 mL.
Echogenicity within normal limits. No nephrolithiasis or
hydronephrosis. No focal renal mass.

Bladder:

Appears normal for degree of bladder distention. Prevoid volume
measures 723.2 postvoid volume measures 0 cc.

Other:

None.
IMPRESSION: 1. Normal renal ultrasound.  No hydronephrosis.
2. Postvoid residual measures 0 cc on this exam.

## 2021-12-24 ENCOUNTER — Ambulatory Visit: Payer: 59 | Admitting: Obstetrics and Gynecology

## 2022-01-06 NOTE — Progress Notes (Signed)
PCP:  Glean Hess, MD   Chief Complaint  Patient presents with   Gynecologic Exam    No concerns     HPI:      [name] is a 36 y.o. G0P0000 who LMP was Patient's last menstrual period was 01/07/2022 (exact date)., presents today for her annual examination.  Her menses are regular every 28-30 days, lasting 5-7 days.  Dysmenorrhea mild, occurring first 1-2 days of flow. She does not have intermenstrual bleeding.  Sex activity: single partner, contraception - none. Declines BC, feels better off OCPs.  Hx of infertility in past with ex-husband. Pt had ovulation with serum progesterone at that time. No pain/bleeding.   Last Pap: 09/20/18  Results were: no abnormalities /neg HPV DNA Hx of STDs: none  Hx of bladder spasms in past after seeing urology.   There is a FH of breast cancer in her MGM, genetic testing not indicated. There is no FH of ovarian cancer. The patient does not do self-breast exams.  Tobacco use: 1/2 ppd Alcohol use: none No drug use.  Exercise: not active  She does get adequate calcium but not Vitamin D in her diet.  She has a hx of GERD and takes nexium daily with sx relief.  She needs Rx RF.   Past Medical History:  Diagnosis Date   Allergic rhinitis    GERD (gastroesophageal reflux disease)    History of urinary tract infection     Past Surgical History:  Procedure Laterality Date   APPENDECTOMY  2003   WISDOM TOOTH EXTRACTION  2005    Family History  Problem Relation Age of Onset   Endometriosis Mother    Fibromyalgia Mother    Irritable bowel syndrome Mother    Melanoma Mother    Hyperlipidemia Father    Hypertension Father    Colitis Father    Crohn's disease Brother    Bladder Cancer Maternal Grandmother 41   Breast cancer Maternal Grandmother 29       has contact   Hypertension Maternal Grandfather    Lung cancer Maternal Grandfather 68   Heart disease Paternal Grandfather    Hypertension Paternal Grandfather      Social History   Socioeconomic History   Marital status: Divorced    Spouse name: Not on file   Number of children: 0   Years of education: Not on file   Highest education level: Not on file  Occupational History   Occupation: RN - Financial controller [employer]  Tobacco Use   Smoking status: Every Day    Packs/day: 0.50    Years: 19.00    Pack years: 9.50    Types: Cigarettes    Start date: 12/20/2004   Smokeless tobacco: Never  Vaping Use   Vaping Use: Never used  Substance and Sexual Activity   Alcohol use: Yes    Comment: weekly   Drug use: No   Sexual activity: Yes    Birth control/protection: None  Other Topics Concern   Not on file  Social History Narrative   Not on file   Social Determinants of Health   Financial Resource Strain: Not on file  Food Insecurity: Not on file  Transportation Needs: Not on file  Physical Activity: Not on file  Stress: Not on file  Social Connections: Not on file  Intimate Partner Violence: Not on file    Current Meds  Medication Sig   fexofenadine (ALLEGRA) 180 MG tablet Take by mouth as needed.  fluticasone (FLONASE) 50 MCG/ACT nasal spray Place into both nostrils as needed.   trospium (SANCTURA) 20 MG tablet 1 TAB TWICE DAILY AS NEEDED FOR BLADDER SYMPTOMS     ROS:  Review of Systems  Constitutional:  Negative for fatigue, fever and unexpected weight change.  Respiratory:  Negative for cough, shortness of breath and wheezing.   Cardiovascular:  Negative for chest pain, palpitations and leg swelling.  Gastrointestinal:  Negative for blood in stool, constipation, diarrhea, nausea and vomiting.  Endocrine: Negative for cold intolerance, heat intolerance and polyuria.  Genitourinary:  Negative for dyspareunia, dysuria, flank pain, frequency, genital sores, hematuria, menstrual problem, pelvic pain, urgency, vaginal bleeding, vaginal discharge and vaginal pain.  Musculoskeletal:  Negative for back pain, joint swelling and myalgias.   Skin:  Negative for rash.  Neurological:  Negative for dizziness, syncope, light-headedness, numbness and headaches.  Hematological:  Negative for adenopathy.  Psychiatric/Behavioral:  Negative for agitation, confusion, sleep disturbance and suicidal ideas. The patient is not nervous/anxious.     Objective: BP 112/80    Ht 5' 0.75" (1.543 m)    Wt 198 lb (89.8 kg)    LMP 01/07/2022 (Exact Date)    BMI 37.72 kg/m    Physical Exam Constitutional:      Appearance: She is well-developed.  Genitourinary:     Vulva normal.     Right Labia: No rash, tenderness or lesions.    Left Labia: No tenderness, lesions or rash.    No vaginal discharge, erythema or tenderness.      Right Adnexa: not tender and no mass present.    Left Adnexa: not tender and no mass present.    No cervical motion tenderness, friability or polyp.     Uterus is not enlarged or tender.  Breasts:    Right: No mass, nipple discharge, skin change or tenderness.     Left: No mass, nipple discharge, skin change or tenderness.  Neck:     Thyroid: No thyromegaly.  Cardiovascular:     Rate and Rhythm: Normal rate and regular rhythm.     Heart sounds: Normal heart sounds. No murmur heard. Pulmonary:     Effort: Pulmonary effort is normal.     Breath sounds: Normal breath sounds.  Abdominal:     Palpations: Abdomen is soft.     Tenderness: There is no abdominal tenderness. There is no guarding or rebound.  Musculoskeletal:        General: Normal range of motion.     Cervical back: Normal range of motion.  Lymphadenopathy:     Cervical: No cervical adenopathy.  Neurological:     General: No focal deficit present.     Mental Status: She is alert and oriented to person, place, and time.     Cranial Nerves: No cranial nerve deficit.  Skin:    General: Skin is warm and dry.  Psychiatric:        Mood and Affect: Mood normal.        Behavior: Behavior normal.        Thought Content: Thought content normal.         Judgment: Judgment normal.  Vitals reviewed.    Assessment/Plan: Encounter for annual routine gynecological examination  Cervical cancer screening - Plan: Cytology - PAP  Screening for HPV (human papillomavirus) - Plan: Cytology - PAP  Gastroesophageal reflux disease without esophagitis - Plan: esomeprazole (NEXIUM) 40 MG capsule; Rx RF. Doing well.     Meds ordered this encounter  Medications  esomeprazole (NEXIUM) 40 MG capsule    Sig: TAKE 1 CAPSULE BY MOUTH ONCE A DAY    Dispense:  90 capsule    Refill:  3    Order Specific Question:   Supervising Provider    Answer:   Gae Dry [[id number]]            GYN counsel adequate intake of calcium and vitamin D, diet and exercise     F/U  Return in about 1 year (around 01/07/2023).  Kalaysia Demonbreun B. Tavaras Goody, PA-C 01/07/2022 10:48 AM

## 2022-01-07 ENCOUNTER — Other Ambulatory Visit (HOSPITAL_COMMUNITY)
Admission: RE | Admit: 2022-01-07 | Discharge: 2022-01-07 | Disposition: A | Discharge status: Home / Self Care | Payer: No Typology Code available for payment source | Source: Ambulatory Visit | Attending: Obstetrics and Gynecology | Admitting: Obstetrics and Gynecology

## 2022-01-07 ENCOUNTER — Other Ambulatory Visit (HOSPITAL_COMMUNITY): Payer: Self-pay

## 2022-01-07 ENCOUNTER — Other Ambulatory Visit: Payer: Self-pay

## 2022-01-07 ENCOUNTER — Ambulatory Visit (INDEPENDENT_AMBULATORY_CARE_PROVIDER_SITE_OTHER): Payer: No Typology Code available for payment source | Admitting: Obstetrics and Gynecology

## 2022-01-07 ENCOUNTER — Encounter: Payer: Self-pay | Admitting: Obstetrics and Gynecology

## 2022-01-07 VITALS — BP 112/80 | Ht 60.75 in | Wt 198.0 lb

## 2022-01-07 DIAGNOSIS — Z1151 Encounter for screening for human papillomavirus (HPV): Secondary | ICD-10-CM | POA: Diagnosis present

## 2022-01-07 DIAGNOSIS — Z124 Encounter for screening for malignant neoplasm of cervix: Secondary | ICD-10-CM

## 2022-01-07 DIAGNOSIS — Z01419 Encounter for gynecological examination (general) (routine) without abnormal findings: Secondary | ICD-10-CM

## 2022-01-07 DIAGNOSIS — K219 Gastro-esophageal reflux disease without esophagitis: Secondary | ICD-10-CM | POA: Diagnosis not present

## 2022-01-07 MED ORDER — ESOMEPRAZOLE MAGNESIUM 40 MG PO CPDR
DELAYED_RELEASE_CAPSULE | Freq: Every day | ORAL | 3 refills | Status: DC
  Filled 2022-01-07: qty 90, 90d supply, fill #0
  Filled 2022-04-06: qty 90, 90d supply, fill #1
  Filled 2022-07-05: qty 90, 90d supply, fill #2
  Filled 2022-10-05: qty 90, 90d supply, fill #3

## 2022-01-07 NOTE — Patient Instructions (Signed)
I value your feedback and you entrusting us with your care. If you get a [facility] patient survey, I would appreciate you taking the time to let us know about your experience today. Thank you! ? ? ?

## 2022-01-11 LAB — CYTOLOGY - PAP
Adequacy: ABSENT
Comment: NEGATIVE
Diagnosis: NEGATIVE
High risk HPV: NEGATIVE

## 2022-04-06 ENCOUNTER — Other Ambulatory Visit (HOSPITAL_COMMUNITY): Payer: Self-pay

## 2022-07-05 ENCOUNTER — Other Ambulatory Visit (HOSPITAL_COMMUNITY): Payer: Self-pay

## 2022-10-05 ENCOUNTER — Other Ambulatory Visit (HOSPITAL_COMMUNITY): Payer: Self-pay

## 2023-01-16 NOTE — Progress Notes (Unsigned)
PCP:  Glean Hess, MD   No chief complaint on file.    HPI:      [name] is a 37 y.o. G0P0000 who LMP was No LMP recorded., presents today for her annual examination.  Her menses are regular every 28-30 days, lasting 5-7 days.  Dysmenorrhea mild, occurring first 1-2 days of flow. She does not have intermenstrual bleeding.  Sex activity: single partner, contraception - none. Declines BC, feels better off OCPs.  Hx of infertility in past with ex-husband. Pt had ovulation with serum progesterone at that time. No pain/bleeding.   Last Pap: 01/07/22 Results were: no abnormalities /neg HPV DNA Hx of STDs: none  Hx of bladder spasms in past after seeing urology.   There is a FH of breast cancer in her MGM, genetic testing not indicated. There is no FH of ovarian cancer. The patient does not do self-breast exams.  Tobacco use: 1/2 ppd Alcohol use: none No drug use.  Exercise: not active  She does get adequate calcium but not Vitamin D in her diet.  She has a hx of GERD and takes nexium daily with sx relief.  She needs Rx RF.   Past Medical History:  Diagnosis Date   Allergic rhinitis    GERD (gastroesophageal reflux disease)    History of urinary tract infection     Past Surgical History:  Procedure Laterality Date   APPENDECTOMY  2003   WISDOM TOOTH EXTRACTION  2005    Family History  Problem Relation Age of Onset   Endometriosis Mother    Fibromyalgia Mother    Irritable bowel syndrome Mother    Melanoma Mother    Hyperlipidemia Father    Hypertension Father    Colitis Father    Crohn's disease Brother    Bladder Cancer Maternal Grandmother 55   Breast cancer Maternal Grandmother 45       has contact   Hypertension Maternal Grandfather    Lung cancer Maternal Grandfather 68   Heart disease Paternal Grandfather    Hypertension Paternal Grandfather     Social History   Socioeconomic History   Marital status: Divorced    Spouse name: Not  on file   Number of children: 0   Years of education: Not on file   Highest education level: Not on file  Occupational History   Occupation: RN - [employer]  Tobacco Use   Smoking status: Every Day    Packs/day: 0.50    Years: 19.00    Total pack years: 9.50    Types: Cigarettes    Start date: 12/20/2004   Smokeless tobacco: Never  Vaping Use   Vaping Use: Never used  Substance and Sexual Activity   Alcohol use: Yes    Comment: weekly   Drug use: No   Sexual activity: Yes    Birth control/protection: None  Other Topics Concern   Not on file  Social History Narrative   Not on file   Social Determinants of Health   Financial Resource Strain: Not on file  Food Insecurity: Not on file  Transportation Needs: Not on file  Physical Activity: Not on file  Stress: Not on file  Social Connections: Not on file  Intimate Partner Violence: Not on file    No outpatient medications have been marked as taking for the 01/17/23 encounter (Appointment) with Xzayvier Fagin, Deirdre Evener, PA-C.     ROS:  Review of Systems  Constitutional:  Negative for fatigue, fever  and unexpected weight change.  Respiratory:  Negative for cough, shortness of breath and wheezing.   Cardiovascular:  Negative for chest pain, palpitations and leg swelling.  Gastrointestinal:  Negative for blood in stool, constipation, diarrhea, nausea and vomiting.  Endocrine: Negative for cold intolerance, heat intolerance and polyuria.  Genitourinary:  Negative for dyspareunia, dysuria, flank pain, frequency, genital sores, hematuria, menstrual problem, pelvic pain, urgency, vaginal bleeding, vaginal discharge and vaginal pain.  Musculoskeletal:  Negative for back pain, joint swelling and myalgias.  Skin:  Negative for rash.  Neurological:  Negative for dizziness, syncope, light-headedness, numbness and headaches.  Hematological:  Negative for adenopathy.  Psychiatric/Behavioral:  Negative for agitation, confusion, sleep  disturbance and suicidal ideas. The patient is not nervous/anxious.      Objective: There were no vitals taken for this visit.   Physical Exam Constitutional:      Appearance: She is well-developed.  Genitourinary:     Vulva normal.     Right Labia: No rash, tenderness or lesions.    Left Labia: No tenderness, lesions or rash.    No vaginal discharge, erythema or tenderness.      Right Adnexa: not tender and no mass present.    Left Adnexa: not tender and no mass present.    No cervical motion tenderness, friability or polyp.     Uterus is not enlarged or tender.  Breasts:    Right: No mass, nipple discharge, skin change or tenderness.     Left: No mass, nipple discharge, skin change or tenderness.  Neck:     Thyroid: No thyromegaly.  Cardiovascular:     Rate and Rhythm: Normal rate and regular rhythm.     Heart sounds: Normal heart sounds. No murmur heard. Pulmonary:     Effort: Pulmonary effort is normal.     Breath sounds: Normal breath sounds.  Abdominal:     Palpations: Abdomen is soft.     Tenderness: There is no abdominal tenderness. There is no guarding or rebound.  Musculoskeletal:        General: Normal range of motion.     Cervical back: Normal range of motion.  Lymphadenopathy:     Cervical: No cervical adenopathy.  Neurological:     General: No focal deficit present.     Mental Status: She is alert and oriented to person, place, and time.     Cranial Nerves: No cranial nerve deficit.  Skin:    General: Skin is warm and dry.  Psychiatric:        Mood and Affect: Mood normal.        Behavior: Behavior normal.        Thought Content: Thought content normal.        Judgment: Judgment normal.  Vitals reviewed.     Assessment/Plan: Encounter for annual routine gynecological examination  Cervical cancer screening - Plan: Cytology - PAP  Screening for HPV (human papillomavirus) - Plan: Cytology - PAP  Gastroesophageal reflux disease without  esophagitis - Plan: esomeprazole (NEXIUM) 40 MG capsule; Rx RF. Doing well.     No orders of the defined types were placed in this encounter.           GYN counsel adequate intake of calcium and vitamin D, diet and exercise     F/U  No follow-ups on file.  Jo-Ann Johanning B. Soriyah Osberg, PA-C 01/16/2023 6:49 PM

## 2023-01-17 ENCOUNTER — Other Ambulatory Visit (HOSPITAL_COMMUNITY): Payer: Self-pay

## 2023-01-17 ENCOUNTER — Encounter: Payer: Self-pay | Admitting: Obstetrics and Gynecology

## 2023-01-17 ENCOUNTER — Ambulatory Visit (INDEPENDENT_AMBULATORY_CARE_PROVIDER_SITE_OTHER): Payer: 59 | Admitting: Obstetrics and Gynecology

## 2023-01-17 VITALS — BP 112/70 | Ht 60.75 in | Wt 208.0 lb

## 2023-01-17 DIAGNOSIS — Z1321 Encounter for screening for nutritional disorder: Secondary | ICD-10-CM

## 2023-01-17 DIAGNOSIS — Z13220 Encounter for screening for lipoid disorders: Secondary | ICD-10-CM

## 2023-01-17 DIAGNOSIS — K219 Gastro-esophageal reflux disease without esophagitis: Secondary | ICD-10-CM

## 2023-01-17 DIAGNOSIS — Z01419 Encounter for gynecological examination (general) (routine) without abnormal findings: Secondary | ICD-10-CM | POA: Diagnosis not present

## 2023-01-17 DIAGNOSIS — Z0000 Encounter for general adult medical examination without abnormal findings: Secondary | ICD-10-CM

## 2023-01-17 MED ORDER — ESOMEPRAZOLE MAGNESIUM 40 MG PO CPDR
40.0000 mg | DELAYED_RELEASE_CAPSULE | Freq: Every day | ORAL | 3 refills | Status: DC
  Filled 2023-04-13: qty 90, 90d supply, fill #0
  Filled 2023-07-07: qty 90, 90d supply, fill #1
  Filled 2023-10-10: qty 90, 90d supply, fill #2
  Filled 2024-01-10: qty 90, 90d supply, fill #3

## 2023-01-17 MED ORDER — ESOMEPRAZOLE MAGNESIUM 40 MG PO CPDR
40.0000 mg | DELAYED_RELEASE_CAPSULE | Freq: Every day | ORAL | 3 refills | Status: DC
  Filled 2023-01-17: qty 90, 90d supply, fill #0

## 2023-01-18 ENCOUNTER — Other Ambulatory Visit (HOSPITAL_COMMUNITY): Payer: Self-pay

## 2023-02-04 DIAGNOSIS — Z1321 Encounter for screening for nutritional disorder: Secondary | ICD-10-CM | POA: Diagnosis not present

## 2023-02-04 DIAGNOSIS — Z13220 Encounter for screening for lipoid disorders: Secondary | ICD-10-CM | POA: Diagnosis not present

## 2023-02-04 DIAGNOSIS — Z0000 Encounter for general adult medical examination without abnormal findings: Secondary | ICD-10-CM | POA: Diagnosis not present

## 2023-02-05 LAB — COMPREHENSIVE METABOLIC PANEL
ALT: 19 IU/L (ref 0–32)
AST: 22 IU/L (ref 0–40)
Albumin/Globulin Ratio: 2 (ref 1.2–2.2)
Albumin: 4.5 g/dL (ref 3.9–4.9)
Alkaline Phosphatase: 82 IU/L (ref 44–121)
BUN/Creatinine Ratio: 14 (ref 9–23)
BUN: 10 mg/dL (ref 6–20)
Bilirubin Total: 0.5 mg/dL (ref 0.0–1.2)
CO2: 24 mmol/L (ref 20–29)
Calcium: 9.3 mg/dL (ref 8.7–10.2)
Chloride: 105 mmol/L (ref 96–106)
Creatinine, Ser: 0.74 mg/dL (ref 0.57–1.00)
Globulin, Total: 2.3 g/dL (ref 1.5–4.5)
Glucose: 87 mg/dL (ref 70–99)
Potassium: 4.7 mmol/L (ref 3.5–5.2)
Sodium: 142 mmol/L (ref 134–144)
Total Protein: 6.8 g/dL (ref 6.0–8.5)
eGFR: 107 mL/min/{1.73_m2} (ref >59)

## 2023-02-05 LAB — LIPID PANEL
Chol/HDL Ratio: 1.9 ratio (ref 0.0–4.4)
Cholesterol, Total: 195 mg/dL (ref 100–199)
HDL: 105 mg/dL (ref >39)
LDL Chol Calc (NIH): 72 mg/dL (ref 0–99)
Triglycerides: 109 mg/dL (ref 0–149)
VLDL Cholesterol Cal: 18 mg/dL (ref 5–40)

## 2023-02-05 LAB — VITAMIN D 25 HYDROXY (VIT D DEFICIENCY, FRACTURES): Vit D, 25-Hydroxy: 15.1 ng/mL — ABNORMAL LOW (ref 30.0–100.0)

## 2023-03-23 ENCOUNTER — Other Ambulatory Visit (HOSPITAL_COMMUNITY): Payer: Self-pay

## 2023-04-13 ENCOUNTER — Other Ambulatory Visit (HOSPITAL_COMMUNITY): Payer: Self-pay

## 2023-06-17 ENCOUNTER — Telehealth: Payer: 59 | Admitting: Family Medicine

## 2023-06-17 ENCOUNTER — Other Ambulatory Visit (HOSPITAL_COMMUNITY): Payer: Self-pay

## 2023-06-17 DIAGNOSIS — J019 Acute sinusitis, unspecified: Secondary | ICD-10-CM | POA: Diagnosis not present

## 2023-06-17 DIAGNOSIS — B9689 Other specified bacterial agents as the cause of diseases classified elsewhere: Secondary | ICD-10-CM

## 2023-06-17 MED ORDER — AMOXICILLIN-POT CLAVULANATE 875-125 MG PO TABS
1.0000 | ORAL_TABLET | Freq: Two times a day (BID) | ORAL | 0 refills | Status: AC
  Filled 2023-06-17: qty 14, 7d supply, fill #0

## 2023-06-17 NOTE — Progress Notes (Signed)

## 2023-07-07 ENCOUNTER — Other Ambulatory Visit (HOSPITAL_COMMUNITY): Payer: Self-pay

## 2023-08-17 ENCOUNTER — Other Ambulatory Visit: Payer: Self-pay

## 2023-08-17 ENCOUNTER — Telehealth: Payer: Self-pay

## 2023-08-17 DIAGNOSIS — K219 Gastro-esophageal reflux disease without esophagitis: Secondary | ICD-10-CM

## 2023-08-17 NOTE — Telephone Encounter (Signed)
Amb ref in epic for egd. Several dates sent to pt for possible EGD. Will await further communication from pt.

## 2023-08-17 NOTE — Telephone Encounter (Signed)
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 Thanks [employer]

## 2023-08-18 NOTE — Telephone Encounter (Signed)
Pt scheduled for egd in the [facility] 10/09/23 at 10am. Pt aware of appt.

## 2023-08-18 NOTE — Telephone Encounter (Signed)
Correction pt is scheduled for 10/21 at 10am.

## 2023-08-31 ENCOUNTER — Other Ambulatory Visit: Payer: Self-pay

## 2023-08-31 ENCOUNTER — Other Ambulatory Visit (INDEPENDENT_AMBULATORY_CARE_PROVIDER_SITE_OTHER): Payer: 59

## 2023-08-31 ENCOUNTER — Other Ambulatory Visit (HOSPITAL_COMMUNITY): Payer: Self-pay

## 2023-08-31 ENCOUNTER — Telehealth: Payer: Self-pay | Admitting: Internal Medicine

## 2023-08-31 DIAGNOSIS — R197 Diarrhea, unspecified: Secondary | ICD-10-CM

## 2023-08-31 LAB — CBC WITH DIFFERENTIAL/PLATELET
Basophils Absolute: 0 10*3/uL (ref 0.0–0.1)
Basophils Relative: 0.4 % (ref 0.0–3.0)
Eosinophils Absolute: 0 10*3/uL (ref 0.0–0.7)
Eosinophils Relative: 0.4 % (ref 0.0–5.0)
HCT: 38.5 % (ref 36.0–46.0)
Hemoglobin: 12.8 g/dL (ref 12.0–15.0)
Lymphocytes Relative: 24.7 % (ref 12.0–46.0)
Lymphs Abs: 2.2 10*3/uL (ref 0.7–4.0)
MCHC: 33.2 g/dL (ref 30.0–36.0)
MCV: 101.9 fl — ABNORMAL HIGH (ref 78.0–100.0)
Monocytes Absolute: 0.5 10*3/uL (ref 0.1–1.0)
Monocytes Relative: 5.2 % (ref 3.0–12.0)
Neutro Abs: 6.2 10*3/uL (ref 1.4–7.7)
Neutrophils Relative %: 69.3 % (ref 43.0–77.0)
Platelets: 250 10*3/uL (ref 150.0–400.0)
RBC: 3.78 Mil/uL — ABNORMAL LOW (ref 3.87–5.11)
RDW: 13.7 % (ref 11.5–15.5)
WBC: 8.9 10*3/uL (ref 4.0–10.5)

## 2023-08-31 LAB — COMPREHENSIVE METABOLIC PANEL
ALT: 13 U/L (ref 0–35)
AST: 17 U/L (ref 0–37)
Albumin: 3.9 g/dL (ref 3.5–5.2)
Alkaline Phosphatase: 57 U/L (ref 39–117)
BUN: 6 mg/dL (ref 6–23)
CO2: 25 meq/L (ref 19–32)
Calcium: 8.8 mg/dL (ref 8.4–10.5)
Chloride: 105 meq/L (ref 96–112)
Creatinine, Ser: 0.72 mg/dL (ref 0.40–1.20)
GFR: 106.22 mL/min (ref >60.00)
Glucose, Bld: 104 mg/dL — ABNORMAL HIGH (ref 70–99)
Potassium: 3.6 meq/L (ref 3.5–5.1)
Sodium: 138 meq/L (ref 135–145)
Total Bilirubin: 0.5 mg/dL (ref 0.2–1.2)
Total Protein: 6.5 g/dL (ref 6.0–8.3)

## 2023-08-31 LAB — C-REACTIVE PROTEIN: CRP: <1 mg/dL (ref 0.5–20.0)

## 2023-08-31 MED ORDER — DICYCLOMINE HCL 10 MG PO CAPS
20.0000 mg | ORAL_CAPSULE | Freq: Three times a day (TID) | ORAL | 1 refills | Status: DC | PRN
  Filled 2023-08-31: qty 60, 10d supply, fill #0
  Filled 2024-04-04: qty 60, 10d supply, fill #1

## 2023-08-31 NOTE — Telephone Encounter (Signed)
Patient reports history of IBS with diarrhea  Though over Labor Day weekend she had more frequent loose stools and diarrhea occurring 6-8 times per day She tried over-the-counter IBgard and things seem to abate for a few days but in the last several days her diarrhea has returned and is now bloody Associated with crampy abdominal pain but without fever, no nausea or vomiting  Plan [facility] GI pathogen panel to include C. Difficile If this is negative she will need a fecal calprotectin I will order CBC CMP and CRP today She can use IBgard but I will prescribe Bentyl 20 mg 3 times daily as needed in the interim  She will keep her up booked appointment for now to see Crystalyn Thames next week

## 2023-08-31 NOTE — Telephone Encounter (Signed)
Gave patient [facility] stool test to complete and bring back to our lab. Order has been placed.

## 2023-09-04 DIAGNOSIS — R197 Diarrhea, unspecified: Secondary | ICD-10-CM | POA: Diagnosis not present

## 2023-09-07 ENCOUNTER — Ambulatory Visit: Payer: 59 | Admitting: Gastroenterology

## 2023-09-14 ENCOUNTER — Other Ambulatory Visit: Payer: 59

## 2023-09-14 ENCOUNTER — Other Ambulatory Visit: Payer: Self-pay

## 2023-09-14 DIAGNOSIS — R197 Diarrhea, unspecified: Secondary | ICD-10-CM

## 2023-09-15 DIAGNOSIS — R197 Diarrhea, unspecified: Secondary | ICD-10-CM | POA: Diagnosis not present

## 2023-09-18 LAB — CALPROTECTIN, FECAL: Calprotectin, Fecal: 4620 ug/g — ABNORMAL HIGH (ref 0–120)

## 2023-09-20 ENCOUNTER — Other Ambulatory Visit (HOSPITAL_COMMUNITY): Payer: Self-pay

## 2023-09-20 ENCOUNTER — Telehealth: Payer: Self-pay | Admitting: *Deleted

## 2023-09-20 DIAGNOSIS — R197 Diarrhea, unspecified: Secondary | ICD-10-CM

## 2023-09-20 MED ORDER — NA SULFATE-K SULFATE-MG SULF 17.5-3.13-1.6 GM/177ML PO SOLN
1.0000 | Freq: Once | ORAL | 0 refills | Status: DC
  Filled 2023-09-20: qty 354, 1d supply, fill #0

## 2023-09-20 MED ORDER — ONDANSETRON HCL 4 MG PO TABS
ORAL_TABLET | ORAL | 0 refills | Status: DC
Start: 2023-09-20 — End: 2023-09-27
  Filled 2023-09-20: qty 2, 1d supply, fill #0

## 2023-09-20 NOTE — Telephone Encounter (Signed)
Prep instructions given to pt

## 2023-09-21 MED ORDER — NA SULFATE-K SULFATE-MG SULF 17.5-3.13-1.6 GM/177ML PO SOLN: 1.0000 | Freq: Once | ORAL | 0 refills | Status: AC

## 2023-09-21 NOTE — Addendum Note (Signed)
Addended by: Karl Bales B on: 09/21/2023 08:01 AM   Modules accepted: Orders

## 2023-09-22 ENCOUNTER — Encounter: Payer: Self-pay | Admitting: Internal Medicine

## 2023-09-22 ENCOUNTER — Other Ambulatory Visit (HOSPITAL_COMMUNITY): Payer: Self-pay

## 2023-09-27 ENCOUNTER — Ambulatory Visit: Payer: 59 | Admitting: Internal Medicine

## 2023-09-27 ENCOUNTER — Encounter: Payer: Self-pay | Admitting: Internal Medicine

## 2023-09-27 VITALS — BP 134/82 | HR 56 | Temp 98.0°F | Resp 17 | Ht 60.0 in | Wt 208.0 lb

## 2023-09-27 DIAGNOSIS — K2950 Unspecified chronic gastritis without bleeding: Secondary | ICD-10-CM | POA: Diagnosis not present

## 2023-09-27 DIAGNOSIS — D124 Benign neoplasm of descending colon: Secondary | ICD-10-CM | POA: Diagnosis not present

## 2023-09-27 DIAGNOSIS — K6389 Other specified diseases of intestine: Secondary | ICD-10-CM | POA: Diagnosis not present

## 2023-09-27 DIAGNOSIS — R197 Diarrhea, unspecified: Secondary | ICD-10-CM

## 2023-09-27 DIAGNOSIS — K449 Diaphragmatic hernia without obstruction or gangrene: Secondary | ICD-10-CM | POA: Diagnosis not present

## 2023-09-27 DIAGNOSIS — D128 Benign neoplasm of rectum: Secondary | ICD-10-CM | POA: Diagnosis not present

## 2023-09-27 DIAGNOSIS — D123 Benign neoplasm of transverse colon: Secondary | ICD-10-CM | POA: Diagnosis not present

## 2023-09-27 DIAGNOSIS — K317 Polyp of stomach and duodenum: Secondary | ICD-10-CM | POA: Diagnosis not present

## 2023-09-27 DIAGNOSIS — D132 Benign neoplasm of duodenum: Secondary | ICD-10-CM | POA: Diagnosis not present

## 2023-09-27 DIAGNOSIS — K635 Polyp of colon: Secondary | ICD-10-CM | POA: Diagnosis not present

## 2023-09-27 DIAGNOSIS — K621 Rectal polyp: Secondary | ICD-10-CM | POA: Diagnosis not present

## 2023-09-27 DIAGNOSIS — K219 Gastro-esophageal reflux disease without esophagitis: Secondary | ICD-10-CM | POA: Diagnosis not present

## 2023-09-27 MED ORDER — SODIUM CHLORIDE 0.9 % IV SOLN: 500.0000 mL | Freq: Once | INTRAVENOUS | Status: DC

## 2023-09-27 NOTE — Progress Notes (Signed)
GASTROENTEROLOGY PROCEDURE H&P NOTE   Primary Care Physician: Reubin Milan, MD    Reason for Procedure:  Bloody diarrhea and GERD  Plan:    EGD and colonoscopy  Patient is appropriate for endoscopic procedure(s) in the ambulatory ([facility]) setting.  The nature of the procedure, as well as the risks, benefits, and alternatives were carefully and thoroughly reviewed with the patient. Ample time for discussion and questions allowed. The patient understood, was satisfied, and agreed to proceed.     HPI: Elizabeth Day is a 37 y.o. female who presents for EGD and colonoscopy.  Medical history as below.  Tolerated the prep.  No recent chest pain or shortness of breath.  No abdominal pain today.  Past Medical History:  Diagnosis Date   Allergic rhinitis    GERD (gastroesophageal reflux disease)    History of urinary tract infection     Past Surgical History:  Procedure Laterality Date   APPENDECTOMY  2003   WISDOM TOOTH EXTRACTION  2005    Prior to Admission medications   Medication Sig Start Date End Date Taking? Authorizing Provider  esomeprazole (NEXIUM) 40 MG capsule Take 1 capsule (40 mg total) by mouth daily. 01/17/23  Yes Copland, Ilona Sorrel, PA-C  fexofenadine (ALLEGRA) 180 MG tablet Take by mouth as needed.   Yes [provider]  fluticasone (FLONASE) 50 MCG/ACT nasal spray Place into both nostrils as needed.   Yes [provider]  dicyclomine (BENTYL) 10 MG capsule Take 2 capsules (20 mg total) by mouth 3 (three) times daily as needed for spasms. 08/31/23   Meg Niemeier, Carie Caddy, MD  trospium (SANCTURA) 20 MG tablet 1 TAB TWICE DAILY AS NEEDED FOR BLADDER SYMPTOMS 02/21/20   Riki Altes, MD    Current Outpatient Medications  Medication Sig Dispense Refill   esomeprazole (NEXIUM) 40 MG capsule Take 1 capsule (40 mg total) by mouth daily. 90 capsule 3   fexofenadine (ALLEGRA) 180 MG tablet Take by mouth as needed.     fluticasone (FLONASE) 50 MCG/ACT  nasal spray Place into both nostrils as needed.     dicyclomine (BENTYL) 10 MG capsule Take 2 capsules (20 mg total) by mouth 3 (three) times daily as needed for spasms. 60 capsule 1   trospium (SANCTURA) 20 MG tablet 1 TAB TWICE DAILY AS NEEDED FOR BLADDER SYMPTOMS 60 tablet 0   Current Facility-Administered Medications  Medication Dose Route Frequency Provider Last Rate Last Admin   0.9 %  sodium chloride infusion  500 mL Intravenous Once Maximus Hoffert, Carie Caddy, MD        Allergies as of 09/27/2023 - Review Complete 09/27/2023  Allergen Reaction Noted   Guaifenesin Nausea Only 01/17/2023    Family History  Problem Relation Age of Onset   Endometriosis Mother    Fibromyalgia Mother    Irritable bowel syndrome Mother    Melanoma Mother    Hyperlipidemia Father    Hypertension Father    Colitis Father    Crohn's disease Brother    Bladder Cancer Maternal Grandmother 33   Breast cancer Maternal Grandmother 19       has contact   Hypertension Maternal Grandfather    Lung cancer Maternal Grandfather 68   Heart disease Paternal Grandfather    Hypertension Paternal Grandfather    Colon cancer Neg Hx    Esophageal cancer Neg Hx    Rectal cancer Neg Hx    Stomach cancer Neg Hx     Social History  Socioeconomic History   Marital status: Divorced    Spouse name: Not on file   Number of children: 0   Years of education: Not on file   Highest education level: Not on file  Occupational History   Occupation: RN - Adult nurse Endo  Tobacco Use   Smoking status: Every Day    Current packs/day: 0.50    Average packs/day: 0.5 packs/day for 19.0 years (9.5 ttl pk-yrs)    Types: Cigarettes    Start date: 12/20/2004   Smokeless tobacco: Never  Vaping Use   Vaping status: Never Used  Substance and Sexual Activity   Alcohol use: Yes    Comment: weekly   Drug use: No   Sexual activity: Yes    Birth control/protection: None  Other Topics Concern   Not on file  Social History Narrative   Not  on file   Social Determinants of Health   Financial Resource Strain: Not on file  Food Insecurity: Not on file  Transportation Needs: Not on file  Physical Activity: Not on file  Stress: Not on file  Social Connections: Not on file  Intimate Partner Violence: Not on file    Physical Exam: Vital signs in last 24 hours: @BP  (!) 155/96   Pulse 64   Temp 98 F (36.7 C) (Skin)   Ht 5' (1.524 m)   Wt 208 lb (94.3 kg)   LMP 09/19/2023   SpO2 100%   BMI 40.62 kg/m  GEN: NAD EYE: Sclerae anicteric ENT: MMM CV: Non-tachycardic Pulm: CTA b/l GI: Soft, NT/ND NEURO:  Alert & Oriented x 3   Erick Blinks, MD [facility]  09/27/2023 7:39 AM

## 2023-09-27 NOTE — Op Note (Signed)
[facility] Patient Name: Elizabeth Day Procedure Date: 09/27/2023 7:16 AM MRN: [id number] Endoscopist: Beverley Fiedler , MD, [id number] Age: 37 Referring MD:  Date of Birth: 05-Feb-1986 Gender: Female Account #: [id number] Procedure:                Upper GI endoscopy Indications:              Gastro-esophageal reflux disease, current therapy                            Nexium 40 mg daily (longstanding) Medicines:                Monitored Anesthesia Care Procedure:                Pre-Anesthesia Assessment:                           - Prior to the procedure, a History and Physical                            was performed, and patient medications and                            allergies were reviewed. The patient's tolerance of                            previous anesthesia was also reviewed. The risks                            and benefits of the procedure and the sedation                            options and risks were discussed with the patient.                            All questions were answered, and informed consent                            was obtained. Prior Anticoagulants: The patient has                            taken no anticoagulant or antiplatelet agents. ASA                            Grade Assessment: II - A patient with mild systemic                            disease. After reviewing the risks and benefits,                            the patient was deemed in satisfactory condition to                            undergo the procedure.  After obtaining informed consent, the endoscope was                            passed under direct vision. Throughout the                            procedure, the patient's blood pressure, pulse, and                            oxygen saturations were monitored continuously. The                            GIF [id number] #[id number] was introduced through the                            mouth, and advanced  to the second part of duodenum.                            The upper GI endoscopy was accomplished without                            difficulty. The patient tolerated the procedure                            well. Scope In: Scope Out: Findings:                 Normal mucosa was found in the entire esophagus.                           A 3 cm hiatal hernia was present.                           The gastroesophageal flap valve was visualized                            endoscopically and classified as Hill Grade IV (no                            fold, wide open lumen, hiatal hernia present).                           The entire examined stomach was normal. Biopsies                            were taken with a cold forceps for histology and                            Helicobacter pylori testing.                           A single 5 mm sessile polyp was found in the second                            portion  of the duodenum. The polyp was removed with                            a cold snare. Resection and retrieval were complete.                           The exam of the duodenum was otherwise normal.                           Biopsies for histology were taken with a cold                            forceps in the second portion of the duodenum for                            evaluation of celiac disease. Complications:            No immediate complications. Estimated Blood Loss:     Estimated blood loss was minimal. Impression:               - Normal mucosa was found in the entire esophagus.                           - 3 cm hiatal hernia.                           - Normal stomach. Biopsied.                           - A single duodenal polyp. Resected and retrieved.                           - Biopsies were taken with a cold forceps for                            evaluation of celiac disease. Recommendation:           - Patient has a contact number available for                             emergencies. The signs and symptoms of potential                            delayed complications were discussed with the                            patient. Return to normal activities tomorrow.                            Written discharge instructions were provided to the                            patient.                           - Resume  previous diet.                           - Continue present medications.                           - Await pathology results.                           - Hiatal hernia partially explains chronic acid                            reflux. Continued PPI is recommended at this time.                           - See the other procedure note for documentation of                            additional recommendations. Beverley Fiedler, MD 09/27/2023 8:11:27 AM This report has been signed electronically.

## 2023-09-27 NOTE — Progress Notes (Signed)
Report to PACU, RN, vss, BBS= Clear.  

## 2023-09-27 NOTE — Patient Instructions (Addendum)
- Resume previous diet. - Continue present medications. - Await pathology results. - Repeat colonoscopy is recommended for surveillance. The colonoscopy date will be determined after pathology results from today's exam become available for review. - Hiatal hernia partially explains chronic acid reflux. Continued PPI is recommended at this time.   YOU HAD AN ENDOSCOPIC PROCEDURE TODAY AT THE [facility]:   Refer to the procedure report that was given to you for any specific questions about what was found during the examination.  If the procedure report does not answer your questions, please call your gastroenterologist to clarify.  If you requested that your care partner not be given the details of your procedure findings, then the procedure report has been included in a sealed envelope for you to review at your convenience later.  YOU SHOULD EXPECT: Some feelings of bloating in the abdomen. Passage of more gas than usual.  Walking can help get rid of the air that was put into your GI tract during the procedure and reduce the bloating. If you had a lower endoscopy (such as a colonoscopy or flexible sigmoidoscopy) you may notice spotting of blood in your stool or on the toilet paper. If you underwent a bowel prep for your procedure, you may not have a normal bowel movement for a few days.  Please Note:  You might notice some irritation and congestion in your nose or some drainage.  This is from the oxygen used during your procedure.  There is no need for concern and it should clear up in a day or so.  SYMPTOMS TO REPORT IMMEDIATELY:  Following lower endoscopy (colonoscopy or flexible sigmoidoscopy):  Excessive amounts of blood in the stool  Significant tenderness or worsening of abdominal pains  Swelling of the abdomen that is new, acute  Fever of 100F or higher  Following upper endoscopy (EGD)  Vomiting of blood or coffee ground material  New chest pain or pain under the shoulder  blades  Painful or persistently difficult swallowing  New shortness of breath  Fever of 100F or higher  Black, tarry-looking stools  For urgent or emergent issues, a gastroenterologist can be reached at any hour by calling [phone]. Do not use MyChart messaging for urgent concerns.    DIET:  We do recommend a small meal at first, but then you may proceed to your regular diet.  Drink plenty of fluids but you should avoid alcoholic beverages for 24 hours.  ACTIVITY:  You should plan to take it easy for the rest of today and you should NOT DRIVE or use heavy machinery until tomorrow (because of the sedation medicines used during the test).    FOLLOW UP: Our staff will call the number listed on your records the next business day following your procedure.  We will call around 7:15- 8:00 am to check on you and address any questions or concerns that you may have regarding the information given to you following your procedure. If we do not reach you, we will leave a message.     If any biopsies were taken you will be contacted by phone or by letter within the next 1-3 weeks.  Please call us at [phone] if you have not heard about the biopsies in 3 weeks.    SIGNATURES/CONFIDENTIALITY: You and/or your care partner have signed paperwork which will be entered into your electronic medical record.  These signatures attest to the fact that that the information above on your After Visit Summary has  been reviewed and is understood.  Full responsibility of the confidentiality of this discharge information lies with you and/or your care-partner.

## 2023-09-27 NOTE — Progress Notes (Signed)
Called to room to assist during endoscopic procedure.  Patient ID and intended procedure confirmed with present staff. Received instructions for my participation in the procedure from the performing physician.  

## 2023-09-27 NOTE — Op Note (Signed)
[facility] Patient Name: Elizabeth Day Procedure Date: 09/27/2023 7:15 AM MRN: [id number] Endoscopist: Beverley Fiedler , MD, [id number] Age: 37 Referring MD:  Date of Birth: Sep 10, 1986 Gender: Female Account #: [id number] Procedure:                Colonoscopy Indications:              Bloody diarrhea, elevated fecal calprotectin                            (>4000), family history of IBD in 2 first degree                            relatives Medicines:                Monitored Anesthesia Care Procedure:                Pre-Anesthesia Assessment:                           - Prior to the procedure, a History and Physical                            was performed, and patient medications and                            allergies were reviewed. The patient's tolerance of                            previous anesthesia was also reviewed. The risks                            and benefits of the procedure and the sedation                            options and risks were discussed with the patient.                            All questions were answered, and informed consent                            was obtained. Prior Anticoagulants: The patient has                            taken no anticoagulant or antiplatelet agents. ASA                            Grade Assessment: II - A patient with mild systemic                            disease. After reviewing the risks and benefits,                            the patient was deemed in satisfactory condition to  undergo the procedure.                           After obtaining informed consent, the colonoscope                            was passed under direct vision. Throughout the                            procedure, the patient's blood pressure, pulse, and                            oxygen saturations were monitored continuously. The                            CF [id number] #[id number] was introduced through the anus                             and advanced to the terminal ileum. The colonoscopy                            was performed without difficulty. The patient                            tolerated the procedure well. The quality of the                            bowel preparation was good. The terminal ileum,                            ileocecal valve, appendiceal orifice, and rectum                            were photographed. Scope In: 7:53:39 AM Scope Out: 8:06:23 AM Scope Withdrawal Time: 0 hours 11 minutes 12 seconds  Total Procedure Duration: 0 hours 12 minutes 44 seconds  Findings:                 The digital rectal exam was normal.                           The terminal ileum appeared normal.                           A 10 mm polyp was found in the transverse colon.                            The polyp was sessile. The polyp was removed with a                            cold snare. Resection and retrieval were complete.                           A 6 mm polyp was found in the descending colon. The  polyp was sessile. The polyp was removed with a                            cold snare. Resection and retrieval were complete.                           A 5 mm polyp was found in the rectum. The polyp was                            sessile. The polyp was removed with a cold snare.                            Resection and retrieval were complete.                           Patchy mild mucosal changes characterized by                            erythema and granularity were found in the rectum,                            in the sigmoid colon and in the descending colon                            (very minimal and patchy in the descending colon).                            Biopsies were taken with a cold forceps for                            histology.                           A few small-mouthed diverticula were found in the                            ascending colon.                            Internal hemorrhoids were found during                            retroflexion. The hemorrhoids were small. Complications:            No immediate complications. Estimated Blood Loss:     Estimated blood loss was minimal. Impression:               - The examined portion of the ileum was normal.                           - One 10 mm polyp in the transverse colon, removed                            with a cold snare. Resected and retrieved.                           -  One 6 mm polyp in the descending colon, removed                            with a cold snare. Resected and retrieved.                           - One 5 mm polyp in the rectum, removed with a cold                            snare. Resected and retrieved.                           - Patchy mild mucosal changes were found in the                            rectum, in the sigmoid colon and in the descending                            colon, rule out inflammatory bowel disease (versus                            resolving infectious colitis). Biopsied.                           - Mild diverticulosis in the ascending colon.                           - Small internal hemorrhoids. Recommendation:           - Patient has a contact number available for                            emergencies. The signs and symptoms of potential                            delayed complications were discussed with the                            patient. Return to normal activities tomorrow.                            Written discharge instructions were provided to the                            patient.                           - Resume previous diet.                           - Continue present medications.                           - Await pathology results.                           -  Repeat colonoscopy is recommended for                            surveillance. The colonoscopy date will be                            determined  after pathology results from today's                            exam become available for review. Beverley Fiedler, MD 09/27/2023 8:17:34 AM This report has been signed electronically.

## 2023-09-28 ENCOUNTER — Telehealth: Payer: Self-pay | Admitting: *Deleted

## 2023-09-28 NOTE — Telephone Encounter (Signed)
  Follow up Call-     09/27/2023    7:12 AM  Call back number  Post procedure Call Back phone  # [phone]  Permission to leave phone message Yes     Patient questions:  Do you have a fever, pain , or abdominal swelling? No. Pain Score  0 *  Have you tolerated food without any problems? Yes.    Have you been able to return to your normal activities? Yes.    Do you have any questions about your discharge instructions: Diet   No. Medications  No. Follow up visit  No.  Do you have questions or concerns about your Care? No.  Actions: * If pain score is 4 or above: No action needed, pain <4.

## 2023-09-30 LAB — SURGICAL PATHOLOGY

## 2023-10-10 ENCOUNTER — Encounter: Payer: 59 | Admitting: Internal Medicine

## 2023-10-13 ENCOUNTER — Other Ambulatory Visit (HOSPITAL_COMMUNITY): Payer: Self-pay

## 2023-10-14 ENCOUNTER — Encounter: Payer: Self-pay | Admitting: Internal Medicine

## 2023-10-26 ENCOUNTER — Other Ambulatory Visit: Payer: Self-pay | Admitting: Internal Medicine

## 2023-10-26 DIAGNOSIS — R197 Diarrhea, unspecified: Secondary | ICD-10-CM

## 2023-10-26 DIAGNOSIS — R1084 Generalized abdominal pain: Secondary | ICD-10-CM

## 2023-10-26 NOTE — Progress Notes (Signed)
Recurrent crampy mid and lower abdominal pain Loose stools, nonbloody Bentyl not much help  Repeat fecal calprotectin and given findings that colonoscopy would try some mesalamine

## 2023-10-27 ENCOUNTER — Other Ambulatory Visit: Payer: Self-pay

## 2023-10-27 ENCOUNTER — Ambulatory Visit: Payer: 59

## 2023-10-27 DIAGNOSIS — R1084 Generalized abdominal pain: Secondary | ICD-10-CM

## 2023-10-27 DIAGNOSIS — R197 Diarrhea, unspecified: Secondary | ICD-10-CM | POA: Diagnosis not present

## 2023-10-28 ENCOUNTER — Other Ambulatory Visit (HOSPITAL_COMMUNITY)
Admission: RE | Admit: 2023-10-28 | Discharge: 2023-10-28 | Disposition: A | Discharge status: Home / Self Care | Payer: 59 | Source: Ambulatory Visit

## 2023-10-28 ENCOUNTER — Ambulatory Visit: Payer: 59

## 2023-10-28 ENCOUNTER — Ambulatory Visit (INDEPENDENT_AMBULATORY_CARE_PROVIDER_SITE_OTHER): Payer: 59

## 2023-10-28 VITALS — BP 125/85 | HR 93 | Ht 60.75 in | Wt 197.0 lb

## 2023-10-28 DIAGNOSIS — B379 Candidiasis, unspecified: Secondary | ICD-10-CM

## 2023-10-28 DIAGNOSIS — R399 Unspecified symptoms and signs involving the genitourinary system: Secondary | ICD-10-CM | POA: Diagnosis not present

## 2023-10-28 LAB — POCT URINALYSIS DIPSTICK
Bilirubin, UA: NEGATIVE
Glucose, UA: NEGATIVE
Nitrite, UA: NEGATIVE
Protein, UA: POSITIVE — AB
Urobilinogen, UA: 0.2 U/dL
pH, UA: 6 (ref 5.0–8.0)

## 2023-10-28 MED ORDER — NITROFURANTOIN MONOHYD MACRO 100 MG PO CAPS: 100.0000 mg | ORAL_CAPSULE | Freq: Two times a day (BID) | ORAL | 1 refills | Status: DC

## 2023-10-28 NOTE — Progress Notes (Signed)
   GYN ENCOUNTER  Encounter for UTI work up  Subjective  HPI: OAKLYN DANSBY is a 37 y.o. G0P0000 who presents today for symptoms of UTI. Urinary frequency, bladder spasms, and abdominal pain started about two days ago. She had some spotting on tissue when wiping as well. Has tried medication for bladder spasms without much relief.   Past Medical History:  Diagnosis Date   Allergic rhinitis    GERD (gastroesophageal reflux disease)    History of urinary tract infection    Past Surgical History:  Procedure Laterality Date   APPENDECTOMY  2003   WISDOM TOOTH EXTRACTION  2005   OB History     Gravida  0   Para  0   Term  0   Preterm  0   AB  0   Living  0      SAB  0   IAB  0   Ectopic  0   Multiple  0   Live Births  0          Allergies  Allergen Reactions   Guaifenesin Nausea Only    Review of Systems  12 point ROS negative except for pertinent positives noted in HPI above.   Objective  BP 125/85   Pulse 93   Ht 5' 0.75" (1.543 m)   Wt 197 lb (89.4 kg)   LMP 10/11/2023 (Approximate)   BMI 37.53 kg/m   Physical examination GENERAL APPEARANCE: alert, well appearing LUNGS: normal work of breathing HEART: normal heart rate   Assessment/Plan - Will send urine for culture and treat empirically with Macrobid given symptoms. Swab for vaginal infection self-collected. Will follow up based on results.   Lindalou Hose Alexandar Weisenberger, CNM  10/28/23 11:40 AM

## 2023-10-31 LAB — CERVICOVAGINAL ANCILLARY ONLY
Bacterial Vaginitis (gardnerella): NEGATIVE
Candida Glabrata: NEGATIVE
Candida Vaginitis: POSITIVE — AB
Chlamydia: NEGATIVE
Comment: NEGATIVE
Comment: NEGATIVE
Comment: NEGATIVE
Comment: NEGATIVE
Comment: NEGATIVE
Comment: NORMAL
Neisseria Gonorrhea: NEGATIVE
Trichomonas: NEGATIVE

## 2023-11-01 LAB — URINE CULTURE

## 2023-11-02 ENCOUNTER — Telehealth: Payer: Self-pay

## 2023-11-02 ENCOUNTER — Other Ambulatory Visit (HOSPITAL_COMMUNITY): Payer: Self-pay

## 2023-11-02 ENCOUNTER — Other Ambulatory Visit: Payer: Self-pay

## 2023-11-02 LAB — CALPROTECTIN, FECAL: Calprotectin, Fecal: 210 ug/g — ABNORMAL HIGH (ref 0–120)

## 2023-11-02 MED ORDER — FLUCONAZOLE 150 MG PO TABS: 150.0000 mg | ORAL_TABLET | Freq: Once | ORAL | 3 refills | Status: AC

## 2023-11-02 MED ORDER — MESALAMINE 1.2 G PO TBEC
2.4000 g | DELAYED_RELEASE_TABLET | Freq: Every day | ORAL | 6 refills | Status: DC
  Filled 2023-11-02: qty 120, 60d supply, fill #0
  Filled 2024-02-16: qty 120, 60d supply, fill #1

## 2023-11-02 NOTE — Telephone Encounter (Signed)
Pt calling; saw she had yeast; has used Monistat 1 and is still having sxs; can she have diflucan?  Adv we prefer she use Monistat 7 and call for diflucan if no better.

## 2023-11-02 NOTE — Addendum Note (Signed)
Addended by: Autumn Messing on: 11/02/2023 09:12 AM   Modules accepted: Orders

## 2023-12-08 ENCOUNTER — Other Ambulatory Visit: Payer: Self-pay | Admitting: Urology

## 2023-12-09 ENCOUNTER — Encounter: Payer: Self-pay | Admitting: Obstetrics and Gynecology

## 2023-12-09 ENCOUNTER — Other Ambulatory Visit (HOSPITAL_COMMUNITY): Payer: Self-pay

## 2023-12-09 ENCOUNTER — Other Ambulatory Visit: Payer: Self-pay | Admitting: Obstetrics and Gynecology

## 2023-12-09 MED ORDER — TROSPIUM CHLORIDE 20 MG PO TABS
20.0000 mg | ORAL_TABLET | Freq: Two times a day (BID) | ORAL | 0 refills | Status: AC | PRN
  Filled 2023-12-09: qty 60, 30d supply, fill #0

## 2023-12-09 NOTE — Progress Notes (Signed)
Rx RF trospium prn bladder spasm

## 2023-12-12 ENCOUNTER — Other Ambulatory Visit: Payer: Self-pay

## 2023-12-27 ENCOUNTER — Other Ambulatory Visit (HOSPITAL_COMMUNITY): Payer: Self-pay

## 2024-01-10 ENCOUNTER — Other Ambulatory Visit (HOSPITAL_COMMUNITY): Payer: Self-pay

## 2024-01-31 ENCOUNTER — Ambulatory Visit
Admission: EM | Admit: 2024-01-31 | Discharge: 2024-01-31 | Disposition: A | Discharge status: Home / Self Care | Payer: 59 | Attending: Emergency Medicine | Admitting: Emergency Medicine

## 2024-01-31 DIAGNOSIS — R350 Frequency of micturition: Secondary | ICD-10-CM | POA: Diagnosis not present

## 2024-01-31 LAB — POCT URINALYSIS DIP (MANUAL ENTRY)
Bilirubin, UA: NEGATIVE
Glucose, UA: NEGATIVE mg/dL
Nitrite, UA: NEGATIVE
Protein Ur, POC: 100 mg/dL — AB
Spec Grav, UA: 1.01
Urobilinogen, UA: 0.2 U/dL
pH, UA: 6

## 2024-01-31 MED ORDER — CEPHALEXIN 500 MG PO CAPS: 500.0000 mg | ORAL_CAPSULE | Freq: Three times a day (TID) | ORAL | 0 refills | Status: DC

## 2024-01-31 MED ORDER — FLUCONAZOLE 150 MG PO TABS: 150.0000 mg | ORAL_TABLET | Freq: Every day | ORAL | 0 refills | Status: AC

## 2024-01-31 NOTE — Discharge Instructions (Addendum)
Your urinalysis does not currently show bacteria, your urine will be sent to the lab to determine exactly which bacteria is present, if any changes need to be made to your medications you will be notified  Begin use of Keflex every 8 hours for 5 days, Diflucan may be needed if you begin this experience itching or irritation  You may use over-the-counter Azo to help minimize your symptoms until antibiotic removes bacteria, this medication will turn your urine orange  Increase your fluid intake through use of water  As always practice good hygiene, wiping front to back and avoidance of scented vaginal products to prevent further irritation  If symptoms continue to persist after use of medication or recur please follow-up with urgent care or your primary doctor as needed

## 2024-01-31 NOTE — ED Provider Notes (Signed)
Renaldo Fiddler    CSN: [id number] Arrival date & time: 01/31/24  1902      History   Chief Complaint Chief Complaint  Patient presents with   Back Pain   Urinary Frequency    HPI Elizabeth Day is a 38 y.o. female.   Patient presents for evaluation of urinary frequency, dysuria, right-sided flank pain beginning 1 day ago.  History of reoccurring UTIs, followed by nephrology, attempted use mesalamine.  Denies hematuria, abdominal pain, fever or vaginal symptoms.  Past Medical History:  Diagnosis Date   Allergic rhinitis    GERD (gastroesophageal reflux disease)    History of urinary tract infection     Patient Active Problem List   Diagnosis Date Noted   Bladder spasms 03/11/2021   Environmental and seasonal allergies 03/11/2021   Lower urinary tract symptoms 01/30/2020   Pelvic pressure in female 01/30/2020   Gastroesophageal reflux disease without esophagitis 10/17/2019   Tobacco use 09/20/2018    Past Surgical History:  Procedure Laterality Date   APPENDECTOMY  2003   WISDOM TOOTH EXTRACTION  2005    OB History     Gravida  0   Para  0   Term  0   Preterm  0   AB  0   Living  0      SAB  0   IAB  0   Ectopic  0   Multiple  0   Live Births  0            Home Medications    Prior to Admission medications   Medication Sig Start Date End Date Taking? Authorizing Provider  cephALEXin (KEFLEX) 500 MG capsule Take 1 capsule (500 mg total) by mouth 3 (three) times daily for 5 days. 01/31/24 02/05/24 Yes Ivery Nanney, Elita Boone, NP  fluconazole (DIFLUCAN) 150 MG tablet Take 1 tablet (150 mg total) by mouth daily for 2 doses. 01/31/24 02/02/24 Yes Tomy Khim, Elita Boone, NP  dicyclomine (BENTYL) 10 MG capsule Take 2 capsules (20 mg total) by mouth 3 (three) times daily as needed for spasms. 08/31/23   Pyrtle, Carie Caddy, MD  esomeprazole (NEXIUM) 40 MG capsule Take 1 capsule (40 mg total) by mouth daily. 01/17/23   Copland, Ilona Sorrel, PA-C   fexofenadine (ALLEGRA) 180 MG tablet Take by mouth as needed.    [provider]  fluticasone (FLONASE) 50 MCG/ACT nasal spray Place into both nostrils as needed.    [provider]  mesalamine (LIALDA) 1.2 g EC tablet Take 2 tablets (2.4 g total) by mouth daily with breakfast. 11/02/23   Pyrtle, Carie Caddy, MD  nitrofurantoin, macrocrystal-monohydrate, (MACROBID) 100 MG capsule Take 1 capsule (100 mg total) by mouth 2 (two) times daily. Patient not taking: Reported on 01/31/2024 10/28/23   Free, Lindalou Hose, CNM  trospium (SANCTURA) 20 MG tablet Take 1 tablet (20 mg total) by mouth 2 (two) times daily as needed for bladder symptoms 12/09/23   Copland, Ilona Sorrel, PA-C    Family History Family History  Problem Relation Age of Onset   Endometriosis Mother    Fibromyalgia Mother    Irritable bowel syndrome Mother    Melanoma Mother    Hyperlipidemia Father    Hypertension Father    Colitis Father    Crohn's disease Brother    Bladder Cancer Maternal Grandmother 31   Breast cancer Maternal Grandmother 13       has contact   Hypertension Maternal Grandfather    Lung  cancer Maternal Grandfather 68   Heart disease Paternal Grandfather    Hypertension Paternal Grandfather    Colon cancer Neg Hx    Esophageal cancer Neg Hx    Rectal cancer Neg Hx    Stomach cancer Neg Hx     Social History Social History   Tobacco Use   Smoking status: Every Day    Current packs/day: 0.50    Average packs/day: 0.5 packs/day for 19.3 years (9.7 ttl pk-yrs)    Types: Cigarettes    Start date: 12/20/2004   Smokeless tobacco: Never  Vaping Use   Vaping status: Never Used  Substance Use Topics   Alcohol use: Yes    Comment: weekly   Drug use: No     Allergies   Guaifenesin   Review of Systems Review of Systems   Physical Exam Triage Vital Signs ED Triage Vitals  Encounter Vitals Group     BP 01/31/24 1917 127/85     Systolic BP Percentile --      Diastolic BP Percentile --       Pulse Rate 01/31/24 1917 79     Resp 01/31/24 1917 17     Temp 01/31/24 1917 98.7 F (37.1 C)     Temp src --      SpO2 01/31/24 1917 98 %     Weight --      Height --      Head Circumference --      Peak Flow --      Pain Score 01/31/24 1915 5     Pain Loc --      Pain Education --      Exclude from Growth Chart --    No data found.  Updated Vital Signs BP 127/85   Pulse 79   Temp 98.7 F (37.1 C)   Resp 17   LMP 01/27/2024   SpO2 98%   Visual Acuity Right Eye Distance:   Left Eye Distance:   Bilateral Distance:    Right Eye Near:   Left Eye Near:    Bilateral Near:     Physical Exam Constitutional:      Appearance: Normal appearance.  Eyes:     Extraocular Movements: Extraocular movements intact.  Pulmonary:     Effort: Pulmonary effort is normal.  Abdominal:     General: Abdomen is flat. Bowel sounds are normal.     Palpations: Abdomen is soft.     Tenderness: There is right CVA tenderness. There is no left CVA tenderness.  Neurological:     Mental Status: She is alert and oriented to person, place, and time. Mental status is at baseline.      UC Treatments / Results  Labs (all labs ordered are listed, but only abnormal results are displayed) Labs Reviewed  POCT URINALYSIS DIP (MANUAL ENTRY) - Abnormal; Notable for the following components:      Result Value   Clarity, UA cloudy (*)    Ketones, POC UA small (15) (*)    Blood, UA moderate (*)    Protein Ur, POC =100 (*)    Leukocytes, UA Large (3+) (*)    All other components within normal limits  URINE CULTURE    EKG   Radiology No results found.  Procedures Procedures (including critical care time)  Medications Ordered in UC Medications - No data to display  Initial Impression / Assessment and Plan / UC Course  I have reviewed the triage vital signs and the nursing notes.  Pertinent labs & imaging results that were available during my care of the patient were reviewed by me and  considered in my medical decision making (see chart for details).  Urinary frequency  Urinalysis showing leukocytes and hemoglobin, negative for nitrates, sent for culture, treating prophylactically due to history of recurrent UTI and that she is symptomatic prescribed cephalexin, use of Macrobid last to be similar to 2025, recommended additional supportive care with follow-up with urgent care as needed Final Clinical Impressions(s) / UC Diagnoses   Final diagnoses:  Urinary frequency     Discharge Instructions      Your urinalysis does not currently show bacteria, your urine will be sent to the lab to determine exactly which bacteria is present, if any changes need to be made to your medications you will be notified  Begin use of Keflex every 8 hours for 5 days, Diflucan may be needed if you begin this experience itching or irritation  You may use over-the-counter Azo to help minimize your symptoms until antibiotic removes bacteria, this medication will turn your urine orange  Increase your fluid intake through use of water  As always practice good hygiene, wiping front to back and avoidance of scented vaginal products to prevent further irritation  If symptoms continue to persist after use of medication or recur please follow-up with urgent care or your primary doctor as needed    ED Prescriptions     Medication Sig Dispense Auth. Provider   cephALEXin (KEFLEX) 500 MG capsule Take 1 capsule (500 mg total) by mouth 3 (three) times daily for 5 days. 15 capsule Coe Angelos R, NP   fluconazole (DIFLUCAN) 150 MG tablet Take 1 tablet (150 mg total) by mouth daily for 2 doses. 2 tablet Valinda Hoar, NP      PDMP not reviewed this encounter.   Valinda Hoar, NP 01/31/24 1955

## 2024-01-31 NOTE — ED Triage Notes (Signed)
Patient to Urgent Care with complaints of urinary frequency/ urgency/ dysuria/ right sided flank pain.  Symptoms started yesterday.

## 2024-02-01 ENCOUNTER — Ambulatory Visit: Payer: 59

## 2024-02-02 ENCOUNTER — Encounter: Payer: Self-pay | Admitting: Obstetrics and Gynecology

## 2024-02-02 ENCOUNTER — Other Ambulatory Visit (HOSPITAL_COMMUNITY): Payer: Self-pay

## 2024-02-02 DIAGNOSIS — N390 Urinary tract infection, site not specified: Secondary | ICD-10-CM

## 2024-02-02 LAB — URINE CULTURE: Culture: 70000 — AB

## 2024-02-02 MED ORDER — SULFAMETHOXAZOLE-TRIMETHOPRIM 800-160 MG PO TABS
1.0000 | ORAL_TABLET | Freq: Two times a day (BID) | ORAL | 0 refills | Status: AC
  Filled 2024-02-02: qty 14, 7d supply, fill #0

## 2024-02-02 NOTE — Addendum Note (Signed)
Addended by: Althea Grimmer B on: 02/02/2024 04:50 PM   Modules accepted: Orders

## 2024-02-17 ENCOUNTER — Other Ambulatory Visit (HOSPITAL_COMMUNITY): Payer: Self-pay

## 2024-02-20 ENCOUNTER — Other Ambulatory Visit (HOSPITAL_COMMUNITY): Payer: Self-pay

## 2024-02-20 NOTE — Progress Notes (Unsigned)
 PCP:  Reubin Milan, MD   No chief complaint on file.    HPI:      [name] is a 38 y.o. G0P0000 who LMP was Patient's last menstrual period was 01/27/2024., presents today for her annual examination.  Her menses are regular every 28-30 days, lasting 6-7 days.  Dysmenorrhea mild, occurring first 1-2 days of flow. She does not have intermenstrual bleeding.  Sex activity: single partner, contraception - none. Declines BC, feels better off OCPs.  Hx of infertility in past with ex-husband. Pt had ovulation with serum progesterone at that time. No pain/bleeding.   Last Pap: 01/07/22 Results were: no abnormalities /neg HPV DNA Hx of STDs: none  There is a FH of breast cancer in her MGM, genetic testing not indicated. There is no FH of ovarian cancer. The patient does not do self-breast exams.  Tobacco use: 1/2 ppd Alcohol use: 1-2 drinks daily No drug use.  Exercise: not active  She does get adequate calcium but not Vitamin D in her diet.  She has a hx of GERD and takes nexium daily with sx relief.  She needs Rx RF. 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 [place] Normal labs 2/24  Past Medical History:  Diagnosis Date   Allergic rhinitis    GERD (gastroesophageal reflux disease)    History of urinary tract infection     Past Surgical History:  Procedure Laterality Date   APPENDECTOMY  2003   WISDOM TOOTH EXTRACTION  2005    Family History  Problem Relation Age of Onset   Endometriosis Mother    Fibromyalgia Mother    Irritable bowel syndrome Mother    Melanoma Mother    Hyperlipidemia Father    Hypertension Father    Colitis Father    Crohn's disease Brother    Bladder Cancer Maternal Grandmother 49   Breast cancer Maternal Grandmother 59       has contact   Hypertension Maternal Grandfather    Lung cancer Maternal Grandfather 68   Heart disease Paternal Grandfather    Hypertension Paternal Grandfather    Colon cancer Neg Hx    Esophageal cancer  Neg Hx    Rectal cancer Neg Hx    Stomach cancer Neg Hx     Social History   Socioeconomic History   Marital status: Divorced    Spouse name: Not on file   Number of children: 0   Years of education: Not on file   Highest education level: Not on file  Occupational History   Occupation: RN - Adult nurse Endo  Tobacco Use   Smoking status: Every Day    Current packs/day: 0.50    Average packs/day: 0.5 packs/day for 19.4 years (9.7 ttl pk-yrs)    Types: Cigarettes    Start date: 12/20/2004   Smokeless tobacco: Never  Vaping Use   Vaping status: Never Used  Substance and Sexual Activity   Alcohol use: Yes    Comment: weekly   Drug use: No   Sexual activity: Yes    Birth control/protection: None  Other Topics Concern   Not on file  Social History Narrative   Not on file   Social Drivers of Health   Financial Resource Strain: Not on file  Food Insecurity: Not on file  Transportation Needs: Not on file  Physical Activity: Not on file  Stress: Not on file  Social Connections: Not on file  Intimate Partner Violence: Not on file  No outpatient medications have been marked as taking for the 02/21/24 encounter (Appointment) with Khair Chasteen, Ilona Sorrel, PA-C.     ROS:  Review of Systems  Constitutional:  Negative for fatigue, fever and unexpected weight change.  Respiratory:  Negative for cough, shortness of breath and wheezing.   Cardiovascular:  Negative for chest pain, palpitations and leg swelling.  Gastrointestinal:  Negative for blood in stool, constipation, diarrhea, nausea and vomiting.  Endocrine: Negative for cold intolerance, heat intolerance and polyuria.  Genitourinary:  Negative for dyspareunia, dysuria, flank pain, frequency, genital sores, hematuria, menstrual problem, pelvic pain, urgency, vaginal bleeding, vaginal discharge and vaginal pain.  Musculoskeletal:  Negative for back pain, joint swelling and myalgias.  Skin:  Negative for rash.  Neurological:   Negative for dizziness, syncope, light-headedness, numbness and headaches.  Hematological:  Negative for adenopathy.  Psychiatric/Behavioral:  Negative for agitation, confusion, sleep disturbance and suicidal ideas. The patient is not nervous/anxious.      Objective: LMP 01/27/2024    Physical Exam Constitutional:      Appearance: She is well-developed.  Genitourinary:     Vulva normal.     Right Labia: No rash, tenderness or lesions.    Left Labia: No tenderness, lesions or rash.    No vaginal discharge, erythema or tenderness.      Right Adnexa: not tender and no mass present.    Left Adnexa: not tender and no mass present.    No cervical motion tenderness, friability or polyp.     Uterus is not enlarged or tender.  Breasts:    Right: No mass, nipple discharge, skin change or tenderness.     Left: No mass, nipple discharge, skin change or tenderness.  Neck:     Thyroid: No thyromegaly.  Cardiovascular:     Rate and Rhythm: Normal rate and regular rhythm.     Heart sounds: Normal heart sounds. No murmur heard. Pulmonary:     Effort: Pulmonary effort is normal.     Breath sounds: Normal breath sounds.  Abdominal:     Palpations: Abdomen is soft.     Tenderness: There is no abdominal tenderness. There is no guarding or rebound.  Musculoskeletal:        General: Normal range of motion.     Cervical back: Normal range of motion.  Lymphadenopathy:     Cervical: No cervical adenopathy.  Neurological:     General: No focal deficit present.     Mental Status: She is alert and oriented to person, place, and time.     Cranial Nerves: No cranial nerve deficit.  Skin:    General: Skin is warm and dry.  Psychiatric:        Mood and Affect: Mood normal.        Behavior: Behavior normal.        Thought Content: Thought content normal.        Judgment: Judgment normal.  Vitals reviewed.     Assessment/Plan: Encounter for annual routine gynecological  examination  Gastroesophageal reflux disease without esophagitis - Plan: esomeprazole (NEXIUM) 40 MG capsule, Rx RF. Recommended pt do EGD with GI. F/u if needs ref.   Blood tests for routine general physical examination - Plan: Comprehensive metabolic panel, Lipid panel, VITAMIN D 25 Hydroxy (Vit-D Deficiency, Fractures), VITAMIN D 25 Hydroxy (Vit-D Deficiency, Fractures), Lipid panel, Comprehensive metabolic panel  Screening cholesterol level - Plan: Lipid panel, Lipid panel  Encounter for vitamin deficiency screening - Plan: VITAMIN D 25 Hydroxy (Vit-D Deficiency,  Fractures), VITAMIN D 25 Hydroxy (Vit-D Deficiency, Fractures)     No orders of the defined types were placed in this encounter.           GYN counsel adequate intake of calcium and vitamin D, diet and exercise     F/U  No follow-ups on file.  Naquan Garman B. Manahil Vanzile, PA-C 02/20/2024 8:06 PM

## 2024-02-21 ENCOUNTER — Ambulatory Visit (INDEPENDENT_AMBULATORY_CARE_PROVIDER_SITE_OTHER): Payer: 59 | Admitting: Obstetrics and Gynecology

## 2024-02-21 ENCOUNTER — Other Ambulatory Visit (HOSPITAL_COMMUNITY): Payer: Self-pay

## 2024-02-21 ENCOUNTER — Encounter: Payer: Self-pay | Admitting: Obstetrics and Gynecology

## 2024-02-21 VITALS — BP 128/76 | Ht 60.75 in | Wt 200.0 lb

## 2024-02-21 DIAGNOSIS — N3289 Other specified disorders of bladder: Secondary | ICD-10-CM

## 2024-02-21 DIAGNOSIS — N390 Urinary tract infection, site not specified: Secondary | ICD-10-CM

## 2024-02-21 DIAGNOSIS — K219 Gastro-esophageal reflux disease without esophagitis: Secondary | ICD-10-CM

## 2024-02-21 DIAGNOSIS — Z01419 Encounter for gynecological examination (general) (routine) without abnormal findings: Secondary | ICD-10-CM

## 2024-02-21 MED ORDER — NITROFURANTOIN MONOHYD MACRO 100 MG PO CAPS
ORAL_CAPSULE | ORAL | 2 refills | Status: AC
  Filled 2024-02-21: qty 30, 30d supply, fill #0
  Filled 2024-10-16: qty 30, 30d supply, fill #1

## 2024-02-21 MED ORDER — ESOMEPRAZOLE MAGNESIUM 40 MG PO CPDR
40.0000 mg | DELAYED_RELEASE_CAPSULE | Freq: Every day | ORAL | 3 refills | Status: AC
  Filled 2024-02-21 – 2024-04-04 (×2): qty 90, 90d supply, fill #0
  Filled 2024-07-04: qty 90, 90d supply, fill #1
  Filled 2024-10-01: qty 90, 90d supply, fill #2
  Filled 2024-12-31: qty 90, 90d supply, fill #3

## 2024-02-21 NOTE — Patient Instructions (Signed)
 I value your feedback and you entrusting [place] with your care. If you get a [place] patient survey, I would appreciate you taking the time to let us know about your experience today. Thank you! ? ? ?

## 2024-03-19 ENCOUNTER — Encounter: Payer: Self-pay | Admitting: Urology

## 2024-03-19 ENCOUNTER — Ambulatory Visit (INDEPENDENT_AMBULATORY_CARE_PROVIDER_SITE_OTHER): Payer: 59 | Admitting: Urology

## 2024-03-19 VITALS — BP 157/92 | HR 83 | Ht 60.75 in | Wt 200.0 lb

## 2024-03-19 DIAGNOSIS — N3281 Overactive bladder: Secondary | ICD-10-CM | POA: Diagnosis not present

## 2024-03-19 DIAGNOSIS — N390 Urinary tract infection, site not specified: Secondary | ICD-10-CM | POA: Diagnosis not present

## 2024-03-19 NOTE — Progress Notes (Signed)
 03/19/24 4:01 PM   Elizabeth Day 1986/08/31 [id number]  CC: Recurrent UTI, OAB/bladder pressure  HPI: 38 year old female has been previously seen by both Dr. Celso Amy and Dr. Lonna Cobb in the past.  She was seen by Dr. Lonna Cobb in 2021 and had a renal ultrasound that was benign as well as a normal cystoscopy.  She was on trospium at that time for bladder spasm/pressure.  She is currently referred for reported 3 UTIs since November 2024.  There are E. coli documented cultures from November 2024 in February 2025.  She was originally treated with nitrofurantoin, then the second infection was treated with Keflex with no improvement in symptoms and ultimately changed to Bactrim.  She denies any urinary symptoms today.  She does feel these are associated with sexual activity, and her PCP prescribed Macrobid to be taken postcoitally.  She is using the trospium intermittently for bladder pressure.   PMH: Past Medical History:  Diagnosis Date   Allergic rhinitis    GERD (gastroesophageal reflux disease)    History of urinary tract infection     Surgical History: Past Surgical History:  Procedure Laterality Date   APPENDECTOMY  2003   WISDOM TOOTH EXTRACTION  2005    Family History: Family History  Problem Relation Age of Onset   Endometriosis Mother    Fibromyalgia Mother    Irritable bowel syndrome Mother    Melanoma Mother    Hyperlipidemia Father    Hypertension Father    Colitis Father    Crohn's disease Brother    Bladder Cancer Maternal Grandmother 17   Breast cancer Maternal Grandmother 73       has contact   Hypertension Maternal Grandfather    Lung cancer Maternal Grandfather 68   Heart disease Paternal Grandfather    Hypertension Paternal Grandfather    Colon cancer Neg Hx    Esophageal cancer Neg Hx    Rectal cancer Neg Hx    Stomach cancer Neg Hx     Social History:  reports that she has been smoking cigarettes. She started smoking about 19 years ago. She  has a 9.7 pack-year smoking history. She has never used smokeless tobacco. She reports current alcohol use. She reports that she does not use drugs.  Physical Exam: BP (!) 157/92   Pulse 83   Ht 5' 0.75" (1.543 m)   Wt 200 lb (90.7 kg)   LMP 02/17/2024 (Exact Date)   BMI 38.10 kg/m    Constitutional:  Alert and oriented, No acute distress. Cardiovascular: No clubbing, cyanosis, or edema. Respiratory: Normal respiratory effort, no increased work of breathing. GI: Abdomen is soft, nontender, nondistended, no abdominal masses   Laboratory Data: Reviewed, see HPI  Pertinent Imaging: I have personally viewed and interpreted the renal ultrasound from 2021 with no hydronephrosis or abnormality, postvoid residual 0ml.  Assessment & Plan:   38 year old female with 2 culture documented E. coli infections over the last 4 months.  We discussed the evaluation and treatment of patients with recurrent UTIs at length.  We specifically discussed the differences between asymptomatic bacteriuria and true urinary tract infection.  We discussed the AUA definition of recurrent UTI of at least 2 culture proven symptomatic acute cystitis episodes in a 38-month period, or 3 within a 1 year period.  We discussed the importance of culture directed antibiotic treatment, and antibiotic stewardship.  First-line therapy includes nitrofurantoin(5 days), Bactrim(3 days), or fosfomycin(3 g single dose).  Possible etiologies of recurrent infection include periurethral tissue atrophy  in postmenopausal woman, constipation, sexual activity, incomplete emptying, anatomic abnormalities, and even genetic predisposition.  Finally, we discussed the role of perineal hygiene, timed voiding, adequate hydration, topical vaginal estrogen, cranberry prophylaxis, and low-dose antibiotic prophylaxis.  -Agree with trial of Macrobid postcoital prophylaxis, recommended cranberry tablets twice daily -Consider repeat renal ultrasound if  persistent infections despite above strategies -Can continue trospium as needed for bladder spasm/pressure -RTC 3 to 4 months symptom check  Legrand Rams, MD 03/19/2024  [facility] [address], [address] [place], [place] [address] [phone]

## 2024-04-04 ENCOUNTER — Other Ambulatory Visit: Payer: Self-pay

## 2024-04-04 ENCOUNTER — Other Ambulatory Visit (HOSPITAL_COMMUNITY): Payer: Self-pay

## 2024-04-24 ENCOUNTER — Other Ambulatory Visit (HOSPITAL_COMMUNITY): Payer: Self-pay

## 2024-04-24 ENCOUNTER — Telehealth: Payer: Self-pay | Admitting: Internal Medicine

## 2024-04-24 DIAGNOSIS — K529 Noninfective gastroenteritis and colitis, unspecified: Secondary | ICD-10-CM

## 2024-04-24 DIAGNOSIS — R197 Diarrhea, unspecified: Secondary | ICD-10-CM

## 2024-04-24 MED ORDER — DICYCLOMINE HCL 10 MG PO CAPS
20.0000 mg | ORAL_CAPSULE | Freq: Three times a day (TID) | ORAL | 3 refills | Status: AC | PRN
  Filled 2024-04-24: qty 60, 10d supply, fill #0

## 2024-04-24 MED ORDER — MESALAMINE 1.2 G PO TBEC
4.8000 g | DELAYED_RELEASE_TABLET | Freq: Every day | ORAL | 6 refills | Status: AC
  Filled 2024-04-24: qty 120, 30d supply, fill #0
  Filled 2024-06-04: qty 120, 30d supply, fill #1
  Filled 2024-07-04: qty 120, 30d supply, fill #2
  Filled 2024-08-31: qty 120, 30d supply, fill #3
  Filled 2024-10-16: qty 120, 30d supply, fill #4

## 2024-04-24 NOTE — Telephone Encounter (Signed)
 Patient states that she has had recurrent loose stools with abdominal pain more right-sided of late.  Bentyl  10 to 20 mg 3 times a day is helping.  She has been taking Lialda  2.4 g daily.  Stools can be watery have a different odor at times and have a yellow color  No blood in stool  I recommended fecal calprotectin and C. difficile PCR which I have ordered She can continue Bentyl  10 to 20 mg 3 times daily as needed I also recommended that we increase Lialda  to 4.8 g daily.  I sent the prescription but we will need to see if it is affordable.

## 2024-04-25 ENCOUNTER — Other Ambulatory Visit (HOSPITAL_COMMUNITY): Payer: Self-pay

## 2024-04-25 NOTE — Telephone Encounter (Signed)
 I have spoken to [place] at [facility]. She states that Lialda  30 day supply is around 130 dollars until patient meets her SAVE plan deductible. After she meets deductible, price should decrease. Holland Lundborg thinks this would likely be the case with any prescription right now.

## 2024-05-01 ENCOUNTER — Other Ambulatory Visit

## 2024-05-01 ENCOUNTER — Other Ambulatory Visit (HOSPITAL_COMMUNITY): Payer: Self-pay

## 2024-05-01 DIAGNOSIS — R197 Diarrhea, unspecified: Secondary | ICD-10-CM | POA: Diagnosis not present

## 2024-05-01 DIAGNOSIS — K529 Noninfective gastroenteritis and colitis, unspecified: Secondary | ICD-10-CM | POA: Diagnosis not present

## 2024-05-02 ENCOUNTER — Ambulatory Visit: Payer: Self-pay | Admitting: Internal Medicine

## 2024-05-02 LAB — CLOSTRIDIUM DIFFICILE TOXIN B, QUALITATIVE, REAL-TIME PCR: Toxigenic C. Difficile by PCR: NOT DETECTED

## 2024-05-05 LAB — CALPROTECTIN: Calprotectin: 105 ug/g

## 2024-06-04 ENCOUNTER — Other Ambulatory Visit (HOSPITAL_COMMUNITY): Payer: Self-pay

## 2024-06-18 ENCOUNTER — Ambulatory Visit: Admitting: Physician Assistant

## 2024-07-04 ENCOUNTER — Other Ambulatory Visit (HOSPITAL_BASED_OUTPATIENT_CLINIC_OR_DEPARTMENT_OTHER): Payer: Self-pay

## 2024-07-04 ENCOUNTER — Other Ambulatory Visit: Payer: Self-pay

## 2024-07-05 ENCOUNTER — Other Ambulatory Visit (HOSPITAL_COMMUNITY): Payer: Self-pay

## 2024-08-31 ENCOUNTER — Other Ambulatory Visit: Payer: Self-pay

## 2024-08-31 ENCOUNTER — Other Ambulatory Visit (HOSPITAL_COMMUNITY): Payer: Self-pay

## 2024-08-31 MED ORDER — MESALAMINE 1.2 G PO TBEC
DELAYED_RELEASE_TABLET | Freq: Four times a day (QID) | ORAL | 0 refills | Status: DC
Start: 2024-08-31 — End: 2024-08-31
  Filled 2024-08-31: qty 360, 90d supply, fill #0

## 2024-10-08 ENCOUNTER — Encounter: Payer: Self-pay | Admitting: Obstetrics and Gynecology

## 2024-10-16 ENCOUNTER — Other Ambulatory Visit: Payer: Self-pay

## 2025-01-04 NOTE — Progress Notes (Unsigned)
" ° ° °  NURSE VISIT NOTE  Subjective:    Patient ID: Elizabeth Day, female    DOB: 01/06/1986, 39 y.o.   MRN: [id number]  HPI  Patient is a 39 y.o. G29P0000 female who presents for evaluation of amenorrhea. She believes she could be pregnant. Pregnancy is desired. Sexual Activity: single partner, contraception: none. Current symptoms also include: positive home pregnancy test. Last period was normal.    Objective:    BP (!) 145/101   Pulse 76   Ht 5' 0.75 (1.543 m)   Wt 215 lb 3.2 oz (97.6 kg)   LMP 11/30/2024 (Exact Date)   BMI 41.00 kg/m   Lab Review  Results for orders placed or performed in visit on 01/07/25  POCT urine pregnancy  Result Value Ref Range   Preg Test, Ur Positive (A) Negative    Assessment:   1. Absence of menstruation   2. Encounter for antenatal screening for uncertain dates     Plan:   Pregnancy Test: Positive  Estimated Date of Delivery: None noted. BP Cuff Measurement taken. Cuff Size Adult Small Encouraged well-balanced diet, plenty of rest when needed, pre-natal vitamins daily and walking for exercise.  Discussed self-help for nausea, avoiding OTC medications until consulting provider or pharmacist, other than Tylenol as needed, minimal caffeine (1-2 cups daily) and avoiding alcohol.   She will schedule her nurse visit @ 7-[redacted] wks pregnant, u/s for dating @10  wk, and NOB visit at [redacted] wk pregnant.    Feel free to call with any questions.   Elizabeth Day, CMA   "

## 2025-01-04 NOTE — Patient Instructions (Incomplete)

## 2025-01-07 ENCOUNTER — Ambulatory Visit

## 2025-01-07 VITALS — BP 145/101 | HR 76 | Ht 60.75 in | Wt 215.2 lb

## 2025-01-07 DIAGNOSIS — N912 Amenorrhea, unspecified: Secondary | ICD-10-CM

## 2025-01-07 DIAGNOSIS — Z3687 Encounter for antenatal screening for uncertain dates: Secondary | ICD-10-CM

## 2025-01-07 DIAGNOSIS — Z3201 Encounter for pregnancy test, result positive: Secondary | ICD-10-CM

## 2025-01-07 LAB — POCT URINE PREGNANCY: Preg Test, Ur: POSITIVE — AB

## 2025-01-09 ENCOUNTER — Telehealth: Payer: Self-pay

## 2025-01-09 NOTE — Telephone Encounter (Signed)
 I called Elizabeth Day to follow up, she stated she did shave and she cut the inside of her labia so that's where the bleeding was coming from. She did report having some dark brown blood when she wipes. I did tell her to keep an eye on it and if it becomes bright red, heavy and filling up a pad we want her to go to the ED, patient understood.

## 2025-01-09 NOTE — Telephone Encounter (Signed)
 SABRA

## 2025-01-11 ENCOUNTER — Telehealth: Payer: Self-pay

## 2025-01-11 NOTE — Telephone Encounter (Signed)
 Patient left msg on triage about dull, constant/intermittent left lower abdominal pain for the last 3 hours. Denies pelvic pain. Still dealing with dark colored blood (mainly spotting) for about a week now, denies bright red blood. Denies fevers, diarrhea, nausea and/or vomiting. Advised to monitor the pain, but if it becomes severe along with/or pelvic pain and/or vaginal bleeding to go to ER.

## 2025-01-18 ENCOUNTER — Telehealth

## 2025-01-18 ENCOUNTER — Telehealth: Payer: Self-pay

## 2025-01-18 DIAGNOSIS — Z3401 Encounter for supervision of normal first pregnancy, first trimester: Secondary | ICD-10-CM | POA: Insufficient documentation

## 2025-01-18 DIAGNOSIS — Z3689 Encounter for other specified antenatal screening: Secondary | ICD-10-CM

## 2025-01-18 DIAGNOSIS — N390 Urinary tract infection, site not specified: Secondary | ICD-10-CM

## 2025-01-18 NOTE — Progress Notes (Signed)
 New OB Intake  I connected with  Elizabeth Day on 01/18/25 at 11:15 AM EST by MyChart Video Visit and verified that I am speaking with the correct person using two identifiers. Nurse is located at [facility] and pt is located at home.  I discussed the limitations, risks, security and privacy concerns of performing an evaluation and management service by telephone and the availability of in person appointments. I also discussed with the patient that there may be a patient responsible charge related to this service. The patient expressed understanding and agreed to proceed.  I explained I am completing New OB Intake today. We discussed her EDD of 09/06/25 that is based on LMP of 11/30/24. Pt is G1/P0. I reviewed her allergies, medications, Medical/Surgical/OB history, and appropriate screenings. There are cats in the home: yes. If yes: Outdoor. Based on history, this is a/an pregnancy uncomplicated . Her obstetrical history is significant for N/A.  Patient Active Problem List   Diagnosis Date Noted   Encounter for supervision of normal first pregnancy in first trimester 01/18/2025   Bladder spasms 03/11/2021   Environmental and seasonal allergies 03/11/2021   Lower urinary tract symptoms 01/30/2020   Pelvic pressure in female 01/30/2020   Gastroesophageal reflux disease without esophagitis 10/17/2019   Tobacco use 09/20/2018    Concerns addressed today: Continue to take Vitamin D  & Macrobid ?  Delivery Plans:  Plans to deliver at [facility].  Anatomy US  Explained first scheduled US  will be 02/08/25. Anatomy US  will be scheduled around [redacted] weeks gestational age.  Labs Discussed genetic screening with patient. Patient desires genetic testing to be drawn at new OB visit. Discussed possible labs to be drawn at new OB appointment.  COVID Vaccine Patient has not had COVID vaccine.   Social Determinants of Health Food Insecurity: denies food insecurity Transportation:  Patient denies transportation needs.  First visit review I reviewed new OB appt with pt. I explained she will have blood work and pap smear/pelvic exam if indicated. Explained pt will be seen by Elizabeth Cisco, CNM at first visit; encounter routed to appropriate provider.   Beola Skeens, CMA 01/18/2025  11:40 AM

## 2025-01-18 NOTE — Patient Instructions (Signed)
 First Trimester of Pregnancy: What to Know  The first trimester of pregnancy starts on the first day of your last monthly period until the end of week 13. This is months 1 through 3 of pregnancy. A week after a sperm fertilizes an egg, the egg will implant into the wall of the uterus and begin to develop into a baby. Body changes during your first trimester Your body goes through many changes during pregnancy. The changes usually return to normal after your baby is born. Physical changes Your breasts may grow larger and may hurt. The area around your nipples may get darker. Your periods will stop. Your hair and nails may grow faster. You may pee more often. Health changes You may tire easily. Your gums may bleed and may be sensitive when you brush and floss. You may not feel hungry. You may have heartburn. You may throw up or feel like you may throw up. You may want to eat some foods, but not others. You may have headaches. You may have trouble pooping (constipation). Other changes Your emotions may change from day to day. You may have more dreams. Follow these instructions at home: Medicines Talk to your health care provider if you're taking medicines. Ask if the medicines are safe to take during pregnancy. Your provider may change the medicines that you take. Do not take any medicines unless told to by your provider. Take a prenatal vitamin that has at least 600 micrograms (mcg) of folic acid. Do not use herbal medicines, illegal substances, or medicines that are not approved by your provider. Eating and drinking While you're pregnant your body needs extra food for your growing baby. Talk with your provider about what to eat while pregnant. Activity Most women are able to exercise during pregnancy. Exercises may need to change as your pregnancy goes on. Talk to your provider about your activities and exercise routines. Relieving pain and discomfort Wear a good, supportive bra if  your breasts hurt. Rest with your legs raised if you have leg cramps or low back pain. Safety Wear your seatbelt at all times when you're in a car. Talk to your provider if someone hits you, hurts you, or yells at you. Talk with your provider if you're feeling sad or have thoughts of hurting yourself. Lifestyle Certain things can be harmful while you're pregnant. Follow these rules: Do not use hot tubs, steam rooms, or saunas. Do not douche. Do not use tampons or scented pads. Do not drink alcohol,smoke, vape, or use products with nicotine or tobacco in them. If you need help quitting, talk with your provider. Avoid cat litter boxes and soil used by cats. These things carry germs that can cause harm to your pregnancy and your baby. General instructions Keep all follow-up visits. It helps you and your unborn baby stay as healthy as possible. Write down your questions. Take them to your visits. Your provider will: Talk with you about your overall health. Give you advice or refer you to specialists who can help with different needs, including: Prenatal education classes. Mental health and counseling. Foods and healthy eating. Ask for help if you need help with food. Call your dentist and ask to be seen. Brush your teeth with a soft toothbrush. Floss gently. Where to find more information American Pregnancy Association: americanpregnancy.org [employer]: acog.org Office on [employer] Health: travellesson.ca Contact a health care provider if: You feel dizzy, faint, or have a fever. You vomit or have watery poop (  diarrhea) for 2 days or more. You have abnormal discharge or bleeding from your vagina. You have pain when you pee or your pee smells bad. You have cramps, pain, or pressure in your belly area. Get help right away if: You have trouble breathing or chest pain. You have any kind of injury, such as from a fall or a car crash. These symptoms may  be an emergency. Get help right away. Call 911. Do not wait to see if the symptoms will go away. Do not drive yourself to the hospital. This information is not intended to replace advice given to you by your health care provider. Make sure you discuss any questions you have with your health care provider. Document Revised: 10/15/2024 Document Reviewed: 04/08/2023 Elsevier Patient Education  2025 [employer].  Common Medications Safe in Pregnancy  Acne:      Constipation:  Benzoyl Peroxide     Colace  Clindamycin      Dulcolax Suppository  Topica Erythromycin     Fibercon  Salicylic Acid      Metamucil         Miralax  AVOID:        Senakot   Accutane    Cough:  Retin-A       Cough Drops  Tetracycline      Phenergan  w/ Codeine  if Rx  Minocycline      Robitussin (Plain & DM)  Antibiotics:     Crabs/Lice:  Ceclor       RID  Cephalosporins    AVOID:  E-Mycins      Kwell  Keflex  Macrobid /Macrodantin    Diarrhea:  Penicillin      Kao-Pectate  Zithromax       Imodium AD         PUSH FLUIDS AVOID:       Cipro     Fever:  Tetracycline      Tylenol  (Regular or Extra  Minocycline       Strength)  Levaquin      Extra Strength-Do not          Exceed 8 tabs/24 hrs Caffeine:        200mg /day (equiv. To 1 cup of coffee or  approx. 3 12 oz sodas)         Gas: Cold/Hayfever:       Gas-X  Benadryl       Mylicon  Claritin       Phazyme  **Claritin-D        Chlor-Trimeton    Headaches:  Dimetapp      ASA-Free Excedrin  Drixoral-Non-Drowsy     Cold Compress  Mucinex (Guaifenasin)     Tylenol  (Regular or Extra  Sudafed/Sudafed-12 Hour     Strength)  **Sudafed PE Pseudoephedrine   Tylenol  Cold & Sinus     Vicks Vapor Rub  Zyrtec  **AVOID if Problems With Blood Pressure         Heartburn: Avoid lying down for at least 1 hour after meals  Aciphex      Maalox     Rash:  Milk of Magnesia     Benadryl     Mylanta       1% Hydrocortisone Cream  Pepcid  Pepcid Complete   Sleep  Aids:  Prevacid      Ambien    Prilosec       Benadryl   Rolaids       Chamomile Tea  Tums (Limit 4/day)     Unisom  Tylenol  PM         Warm milk-add vanilla or  Hemorrhoids:       Sugar for taste  Anusol/Anusol H.C.  (RX: Analapram 2.5%)  Sugar Substitutes:  Hydrocortisone OTC     Ok in moderation  Preparation H      Tucks        Vaseline lotion applied to tissue with wiping    Herpes:     Throat:  Acyclovir      Oragel  Famvir  Valtrex     Vaccines:         Flu Shot Leg Cramps:       *Gardasil  Benadryl       Hepatitis A         Hepatitis B Nasal Spray:       Pneumovax  Saline Nasal Spray     Polio Booster         Tetanus Nausea:       Tuberculosis test or PPD  Vitamin B6 25 mg TID   AVOID:    Dramamine      *Gardasil  Emetrol       Live Poliovirus  Ginger Root 250 mg QID    MMR (measles, mumps &  High Complex Carbs @ Bedtime    rebella)  Sea Bands-Accupressure    Varicella (Chickenpox)  Unisom 1/2 tab TID     *No known complications           If received before Pain:         Known pregnancy;   Darvocet       Resume series after  Lortab        Delivery  Percocet    Yeast:   Tramadol      Femstat  Tylenol  3      Gyne-lotrimin  Ultram       Monistat  Vicodin           MISC:         All Sunscreens           Hair Coloring/highlights          Insect Repellant's          (Including DEET)         Mystic Tans   Commonly Asked Questions During Pregnancy  Cats: A parasite can be excreted in cat feces.  To avoid exposure you need to have another person empty the little box.  If you must empty the litter box you will need to wear gloves.  Wash your hands after handling your cat.  This parasite can also be found in raw or undercooked meat so this should also be avoided.  Colds, Sore Throats, Flu: Please check your medication sheet to see what you can take for symptoms.  If your symptoms are unrelieved by these medications please call the office.  Dental Work: Most  any dental work agricultural consultant recommends is permitted.  X-rays should only be taken during the first trimester if absolutely necessary.  Your abdomen should be shielded with a lead apron during all x-rays.  Please notify your provider prior to receiving any x-rays.  Novocaine is fine; gas is not recommended.  If your dentist requires a note from us  prior to dental work please call the office and we will provide one for you.  Exercise: Exercise is an important part of staying healthy during your pregnancy.  You may continue most exercises you were accustomed to prior to pregnancy.  Later  in your pregnancy you will most likely notice you have difficulty with activities requiring balance like riding a bicycle.  It is important that you listen to your body and avoid activities that put you at a higher risk of falling.  Adequate rest and staying well hydrated are a must!  If you have questions about the safety of specific activities ask your provider.    Exposure to Children with illness: Try to avoid obvious exposure; report any symptoms to us  when noted,  If you have chicken pos, red measles or mumps, you should be immune to these diseases.   Please do not take any vaccines while pregnant unless you have checked with your OB provider.  Fetal Movement: After 28 weeks we recommend you do kick counts twice daily.  Lie or sit down in a calm quiet environment and count your baby movements kicks.  You should feel your baby at least 10 times per hour.  If you have not felt 10 kicks within the first hour get up, walk around and have something sweet to eat or drink then repeat for an additional hour.  If count remains less than 10 per hour notify your provider.  Fumigating: Follow your pest control agent's advice as to how long to stay out of your home.  Ventilate the area well before re-entering.  Hemorrhoids:   Most over-the-counter preparations can be used during pregnancy.  Check your medication to see what is  safe to use.  It is important to use a stool softener or fiber in your diet and to drink lots of liquids.  If hemorrhoids seem to be getting worse please call the office.   Hot Tubs:  Hot tubs Jacuzzis and saunas are not recommended while pregnant.  These increase your internal body temperature and should be avoided.  Intercourse:  Sexual intercourse is safe during pregnancy as long as you are comfortable, unless otherwise advised by your provider.  Spotting may occur after intercourse; report any bright red bleeding that is heavier than spotting.  Labor:  If you know that you are in labor, please go to the hospital.  If you are unsure, please call the office and let us  help you decide what to do.  Lifting, straining, etc:  If your job requires heavy lifting or straining please check with your provider for any limitations.  Generally, you should not lift items heavier than that you can lift simply with your hands and arms (no back muscles)  Painting:  Paint fumes do not harm your pregnancy, but may make you ill and should be avoided if possible.  Latex or water based paints have less odor than oils.  Use adequate ventilation while painting.  Permanents & Hair Color:  Chemicals in hair dyes are not recommended as they cause increase hair dryness which can increase hair loss during pregnancy.   Highlighting and permanents are allowed.  Dye may be absorbed differently and permanents may not hold as well during pregnancy.  Sunbathing:  Use a sunscreen, as skin burns easily during pregnancy.  Drink plenty of fluids; avoid over heating.  Tanning Beds:  Because their possible side effects are still unknown, tanning beds are not recommended.  Ultrasound Scans:  Routine ultrasounds are performed at approximately 20 weeks.  You will be able to see your baby's general anatomy an if you would like to know the gender this can usually be determined as well.  If it is questionable when you conceived you may  also receive  an ultrasound early in your pregnancy for dating purposes.  Otherwise ultrasound exams are not routinely performed unless there is a medical necessity.  Although you can request a scan we ask that you pay for it when conducted because insurance does not cover  patient request scans.  Work: If your pregnancy proceeds without complications you may work until your due date, unless your physician or employer advises otherwise.  Round Ligament Pain/Pelvic Discomfort:  Sharp, shooting pains not associated with bleeding are fairly common, usually occurring in the second trimester of pregnancy.  They tend to be worse when standing up or when you remain standing for long periods of time.  These are the result of pressure of certain pelvic ligaments called round ligaments.  Rest, Tylenol  and heat seem to be the most effective relief.  As the womb and fetus grow, they rise out of the pelvis and the discomfort improves.  Please notify the office if your pain seems different than that described.  It may represent a more serious condition.

## 2025-01-18 NOTE — Telephone Encounter (Signed)
 Patient had her NOB intake today. Would like to know if she can continue to take Vitamin D  125 mcg (5000 unit intake), she usually takes it about three times per week. She's taking her prenatal vitamin daily also.  She also wants to know if she can continue to take nitrofurantoin , macrocrystal-monohydrate, (MACROBID ) 100 MG capsule as needed after sex as UTI preventive?

## 2025-01-19 ENCOUNTER — Encounter: Payer: Self-pay | Admitting: Certified Nurse Midwife

## 2025-02-08 ENCOUNTER — Other Ambulatory Visit

## 2025-02-22 ENCOUNTER — Encounter: Admitting: Certified Nurse Midwife
